# Patient Record
Sex: Female | Born: 1935 | Race: Black or African American | Hispanic: No
Health system: Southern US, Community
[De-identification: ages and names within clinical notes are randomized; demographics above are authoritative.]

---

## 2005-04-22 ENCOUNTER — Ambulatory Visit: Payer: Self-pay | Admitting: Family Medicine

## 2005-05-03 ENCOUNTER — Ambulatory Visit: Payer: Self-pay | Admitting: Family Medicine

## 2005-08-25 ENCOUNTER — Ambulatory Visit: Payer: Self-pay | Admitting: Family Medicine

## 2005-11-01 ENCOUNTER — Ambulatory Visit: Payer: Self-pay | Admitting: Family Medicine

## 2006-11-24 ENCOUNTER — Ambulatory Visit: Payer: Self-pay | Admitting: Family Medicine

## 2006-12-21 ENCOUNTER — Ambulatory Visit: Payer: Self-pay | Admitting: Gastroenterology

## 2006-12-30 ENCOUNTER — Ambulatory Visit: Payer: Self-pay | Admitting: Gastroenterology

## 2007-04-13 ENCOUNTER — Inpatient Hospital Stay: Payer: Self-pay | Admitting: Internal Medicine

## 2007-04-13 ENCOUNTER — Other Ambulatory Visit: Payer: Self-pay

## 2007-04-14 ENCOUNTER — Other Ambulatory Visit: Payer: Self-pay

## 2007-04-20 ENCOUNTER — Emergency Department: Payer: Self-pay

## 2007-04-26 ENCOUNTER — Ambulatory Visit: Payer: Self-pay | Admitting: Family Medicine

## 2007-12-22 ENCOUNTER — Ambulatory Visit: Payer: Self-pay

## 2008-04-03 ENCOUNTER — Ambulatory Visit: Payer: Self-pay

## 2008-04-26 ENCOUNTER — Ambulatory Visit: Payer: Self-pay

## 2009-06-23 ENCOUNTER — Ambulatory Visit: Payer: Self-pay | Admitting: Family Medicine

## 2009-07-13 ENCOUNTER — Emergency Department: Payer: Self-pay | Admitting: Emergency Medicine

## 2009-08-07 ENCOUNTER — Encounter: Payer: Self-pay | Admitting: Family Medicine

## 2009-09-06 ENCOUNTER — Encounter: Payer: Self-pay | Admitting: Family Medicine

## 2010-01-12 ENCOUNTER — Emergency Department: Payer: Self-pay | Admitting: Emergency Medicine

## 2010-09-24 ENCOUNTER — Ambulatory Visit: Payer: Self-pay | Admitting: Gastroenterology

## 2010-09-26 LAB — PATHOLOGY REPORT

## 2010-11-13 ENCOUNTER — Ambulatory Visit: Payer: Self-pay | Admitting: Family Medicine

## 2011-02-01 ENCOUNTER — Emergency Department: Payer: Self-pay | Admitting: Emergency Medicine

## 2011-05-01 ENCOUNTER — Emergency Department: Payer: Self-pay | Admitting: Internal Medicine

## 2011-09-17 ENCOUNTER — Emergency Department: Payer: Self-pay | Admitting: *Deleted

## 2011-09-17 LAB — URINALYSIS, COMPLETE
Bilirubin,UR: NEGATIVE
Leukocyte Esterase: NEGATIVE
Nitrite: NEGATIVE
Protein: NEGATIVE
RBC,UR: 4 /HPF (ref 0–5)
Specific Gravity: 1.014 (ref 1.003–1.030)
Squamous Epithelial: 1

## 2011-09-17 LAB — CBC
HCT: 34.5 % — ABNORMAL LOW (ref 35.0–47.0)
HGB: 11.1 g/dL — ABNORMAL LOW (ref 12.0–16.0)
MCH: 29.8 pg (ref 26.0–34.0)
MCHC: 32.1 g/dL (ref 32.0–36.0)
MCV: 93 fL (ref 80–100)
Platelet: 297 10*3/uL (ref 150–440)
RBC: 3.71 10*6/uL — ABNORMAL LOW (ref 3.80–5.20)
RDW: 14 % (ref 11.5–14.5)
WBC: 8.3 10*3/uL (ref 3.6–11.0)

## 2011-09-17 LAB — COMPREHENSIVE METABOLIC PANEL
Albumin: 3.6 g/dL (ref 3.4–5.0)
Alkaline Phosphatase: 55 U/L (ref 50–136)
Anion Gap: 8 (ref 7–16)
Calcium, Total: 9 mg/dL (ref 8.5–10.1)
Co2: 31 mmol/L (ref 21–32)
EGFR (African American): >60
EGFR (Non-African Amer.): 51 — ABNORMAL LOW
Glucose: 118 mg/dL — ABNORMAL HIGH (ref 65–99)
Potassium: 3.5 mmol/L (ref 3.5–5.1)
SGOT(AST): 19 U/L (ref 15–37)
SGPT (ALT): 16 U/L

## 2011-09-18 LAB — PROTIME-INR: INR: 2.5

## 2011-09-18 LAB — TROPONIN I: Troponin-I: <0.02 ng/mL

## 2011-09-18 LAB — LIPASE, BLOOD: Lipase: 118 U/L (ref 73–393)

## 2012-01-11 ENCOUNTER — Emergency Department: Payer: Self-pay | Admitting: Emergency Medicine

## 2012-01-11 LAB — COMPREHENSIVE METABOLIC PANEL
Albumin: 3.4 g/dL (ref 3.4–5.0)
Alkaline Phosphatase: 56 U/L (ref 50–136)
BUN: 30 mg/dL — ABNORMAL HIGH (ref 7–18)
Bilirubin,Total: 0.7 mg/dL (ref 0.2–1.0)
Calcium, Total: 9.1 mg/dL (ref 8.5–10.1)
Co2: 31 mmol/L (ref 21–32)
Creatinine: 1.3 mg/dL (ref 0.60–1.30)
EGFR (African American): 43 — ABNORMAL LOW
EGFR (Non-African Amer.): 37 — ABNORMAL LOW
Glucose: 109 mg/dL — ABNORMAL HIGH (ref 65–99)
Potassium: 3.8 mmol/L (ref 3.5–5.1)
SGOT(AST): 20 U/L (ref 15–37)
SGPT (ALT): 19 U/L
Sodium: 135 mmol/L — ABNORMAL LOW (ref 136–145)
Total Protein: 7.4 g/dL (ref 6.4–8.2)

## 2012-01-11 LAB — URINALYSIS, COMPLETE
Bilirubin,UR: NEGATIVE
Blood: NEGATIVE
Hyaline Cast: 30
Protein: NEGATIVE
RBC,UR: 1 /HPF (ref 0–5)
Specific Gravity: 1.01 (ref 1.003–1.030)

## 2012-01-11 LAB — CBC
HCT: 35.7 % (ref 35.0–47.0)
HGB: 11.5 g/dL — ABNORMAL LOW (ref 12.0–16.0)
MCH: 29.2 pg (ref 26.0–34.0)
MCHC: 32.3 g/dL (ref 32.0–36.0)
Platelet: 290 10*3/uL (ref 150–440)
RDW: 14.7 % — ABNORMAL HIGH (ref 11.5–14.5)

## 2012-01-11 LAB — LIPASE, BLOOD: Lipase: 129 U/L (ref 73–393)

## 2012-05-16 ENCOUNTER — Ambulatory Visit: Payer: Self-pay | Admitting: Specialist

## 2012-05-31 LAB — URINALYSIS, COMPLETE
Bacteria: NONE SEEN
Ph: 5 (ref 4.5–8.0)
Protein: NEGATIVE
RBC,UR: 13 /HPF (ref 0–5)
Specific Gravity: 1.013 (ref 1.003–1.030)
Squamous Epithelial: 1
WBC UR: 12 /HPF (ref 0–5)

## 2012-05-31 LAB — CBC
HCT: 36.6 % (ref 35.0–47.0)
HGB: 12.5 g/dL (ref 12.0–16.0)
MCHC: 34.1 g/dL (ref 32.0–36.0)
MCV: 89 fL (ref 80–100)
Platelet: 391 10*3/uL (ref 150–440)
RBC: 4.09 10*6/uL (ref 3.80–5.20)
RDW: 13.9 % (ref 11.5–14.5)

## 2012-05-31 LAB — COMPREHENSIVE METABOLIC PANEL
Albumin: 4 g/dL (ref 3.4–5.0)
Alkaline Phosphatase: 67 U/L (ref 50–136)
Bilirubin,Total: 0.3 mg/dL (ref 0.2–1.0)
Calcium, Total: 9.5 mg/dL (ref 8.5–10.1)
Chloride: 104 mmol/L (ref 98–107)
Co2: 29 mmol/L (ref 21–32)
EGFR (African American): >60
EGFR (Non-African Amer.): 53 — ABNORMAL LOW
Osmolality: 284 (ref 275–301)
SGOT(AST): 22 U/L (ref 15–37)
SGPT (ALT): 19 U/L (ref 12–78)

## 2012-05-31 LAB — PROTIME-INR: Prothrombin Time: 26.8 secs — ABNORMAL HIGH (ref 11.5–14.7)

## 2012-05-31 LAB — LIPASE, BLOOD: Lipase: 163 U/L (ref 73–393)

## 2012-06-01 ENCOUNTER — Inpatient Hospital Stay: Payer: Self-pay | Admitting: Student

## 2012-06-01 LAB — HEMOGLOBIN
HGB: 11.2 g/dL — ABNORMAL LOW (ref 12.0–16.0)
HGB: 10.8 g/dL — ABNORMAL LOW (ref 12.0–16.0)

## 2012-06-01 LAB — PROTIME-INR: Prothrombin Time: 24 secs — ABNORMAL HIGH (ref 11.5–14.7)

## 2012-06-02 LAB — COMPREHENSIVE METABOLIC PANEL
Albumin: 2.9 g/dL — ABNORMAL LOW (ref 3.4–5.0)
Anion Gap: 7 (ref 7–16)
Bilirubin,Total: 0.6 mg/dL (ref 0.2–1.0)
Chloride: 108 mmol/L — ABNORMAL HIGH (ref 98–107)
Co2: 28 mmol/L (ref 21–32)
Creatinine: 0.67 mg/dL (ref 0.60–1.30)
EGFR (African American): >60
Glucose: 83 mg/dL (ref 65–99)
Potassium: 3.5 mmol/L (ref 3.5–5.1)
SGOT(AST): 14 U/L — ABNORMAL LOW (ref 15–37)
SGPT (ALT): 13 U/L (ref 12–78)
Sodium: 143 mmol/L (ref 136–145)
Total Protein: 6.1 g/dL — ABNORMAL LOW (ref 6.4–8.2)

## 2012-06-02 LAB — CBC WITH DIFFERENTIAL/PLATELET
Basophil #: 0 10*3/uL (ref 0.0–0.1)
Basophil %: 1 %
Eosinophil %: 2.3 %
HCT: 30.4 % — ABNORMAL LOW (ref 35.0–47.0)
HGB: 10.1 g/dL — ABNORMAL LOW (ref 12.0–16.0)
Lymphocyte #: 1.7 10*3/uL (ref 1.0–3.6)
Lymphocyte %: 39.8 %
MCH: 29.3 pg (ref 26.0–34.0)
MCHC: 33.1 g/dL (ref 32.0–36.0)
MCV: 89 fL (ref 80–100)
Monocyte %: 12.2 %
Neutrophil #: 1.9 10*3/uL (ref 1.4–6.5)
Neutrophil %: 44.7 %
RDW: 14 % (ref 11.5–14.5)
WBC: 4.2 10*3/uL (ref 3.6–11.0)

## 2012-06-02 LAB — PROTIME-INR: Prothrombin Time: 22.1 secs — ABNORMAL HIGH (ref 11.5–14.7)

## 2012-06-03 LAB — CBC WITH DIFFERENTIAL/PLATELET
Basophil #: 0.1 10*3/uL (ref 0.0–0.1)
Eosinophil %: 1.9 %
Lymphocyte #: 1.8 10*3/uL (ref 1.0–3.6)
Lymphocyte %: 30.4 %
Monocyte %: 11.8 %
Neutrophil %: 54.9 %
Platelet: 340 10*3/uL (ref 150–440)
RDW: 14 % (ref 11.5–14.5)
WBC: 5.8 10*3/uL (ref 3.6–11.0)

## 2012-06-03 LAB — HEMOGLOBIN: HGB: 8.5 g/dL — ABNORMAL LOW (ref 12.0–16.0)

## 2012-06-03 LAB — PROTIME-INR: Prothrombin Time: 21.5 secs — ABNORMAL HIGH (ref 11.5–14.7)

## 2012-06-04 LAB — URINALYSIS, COMPLETE
Nitrite: NEGATIVE
RBC,UR: 2 /HPF (ref 0–5)
Specific Gravity: 1.008 (ref 1.003–1.030)
Squamous Epithelial: 8
WBC UR: 2 /HPF (ref 0–5)

## 2012-06-04 LAB — CBC WITH DIFFERENTIAL/PLATELET
Basophil %: 1.3 %
Eosinophil #: 0.1 10*3/uL (ref 0.0–0.7)
HGB: 8.2 g/dL — ABNORMAL LOW (ref 12.0–16.0)
Lymphocyte #: 2 10*3/uL (ref 1.0–3.6)
Lymphocyte %: 28 %
MCH: 30.5 pg (ref 26.0–34.0)
MCHC: 34.6 g/dL (ref 32.0–36.0)
Monocyte #: 0.9 x10 3/mm (ref 0.2–0.9)
Neutrophil %: 56.7 %
RBC: 2.68 10*6/uL — ABNORMAL LOW (ref 3.80–5.20)
RDW: 13.9 % (ref 11.5–14.5)

## 2012-06-04 LAB — PROTIME-INR: Prothrombin Time: 18.5 secs — ABNORMAL HIGH (ref 11.5–14.7)

## 2012-06-04 LAB — HEMOGLOBIN: HGB: 8.1 g/dL — ABNORMAL LOW (ref 12.0–16.0)

## 2012-06-05 LAB — SYNOVIAL CELL COUNT + DIFF, W/ CRYSTALS
Basophil: 0 %
Lymphocytes: 5 %
Neutrophils: 88 %
Nucleated Cell Count: 4954 /mm3
Other Mononuclear Cells: 7 %

## 2012-06-05 LAB — CBC WITH DIFFERENTIAL/PLATELET
Basophil #: 0 10*3/uL (ref 0.0–0.1)
Eosinophil #: 0.1 10*3/uL (ref 0.0–0.7)
Eosinophil %: 0.7 %
Lymphocyte %: 16.3 %
MCH: 29.9 pg (ref 26.0–34.0)
MCHC: 33.8 g/dL (ref 32.0–36.0)
Monocyte #: 1 x10 3/mm — ABNORMAL HIGH (ref 0.2–0.9)
Neutrophil %: 73.5 %
Platelet: 304 10*3/uL (ref 150–440)
RDW: 13.7 % (ref 11.5–14.5)

## 2012-06-05 LAB — URINE CULTURE

## 2012-06-06 LAB — BASIC METABOLIC PANEL
BUN: 9 mg/dL (ref 7–18)
Calcium, Total: 8.7 mg/dL (ref 8.5–10.1)
Chloride: 101 mmol/L (ref 98–107)
Co2: 28 mmol/L (ref 21–32)
Creatinine: 0.9 mg/dL (ref 0.60–1.30)
EGFR (African American): >60
EGFR (Non-African Amer.): 58 — ABNORMAL LOW
Glucose: 173 mg/dL — ABNORMAL HIGH (ref 65–99)
Potassium: 3.5 mmol/L (ref 3.5–5.1)
Sodium: 139 mmol/L (ref 136–145)

## 2012-06-06 LAB — URIC ACID: Uric Acid: 5.7 mg/dL (ref 2.6–6.0)

## 2012-06-06 LAB — CBC WITH DIFFERENTIAL/PLATELET
Basophil #: 0.1 10*3/uL (ref 0.0–0.1)
Basophil %: 0.5 %
Eosinophil %: 0 %
HCT: 24.8 % — ABNORMAL LOW (ref 35.0–47.0)
HGB: 8.4 g/dL — ABNORMAL LOW (ref 12.0–16.0)
Lymphocyte %: 6.8 %
MCHC: 33.8 g/dL (ref 32.0–36.0)
Monocyte %: 3.2 %
Neutrophil #: 12.3 10*3/uL — ABNORMAL HIGH (ref 1.4–6.5)
RBC: 2.81 10*6/uL — ABNORMAL LOW (ref 3.80–5.20)
RDW: 13.8 % (ref 11.5–14.5)
WBC: 13.7 10*3/uL — ABNORMAL HIGH (ref 3.6–11.0)

## 2012-06-07 LAB — CBC WITH DIFFERENTIAL/PLATELET
Basophil %: 0.1 %
Eosinophil #: 0 10*3/uL (ref 0.0–0.7)
Eosinophil %: 0 %
Lymphocyte #: 0.9 10*3/uL — ABNORMAL LOW (ref 1.0–3.6)
MCHC: 33.5 g/dL (ref 32.0–36.0)
MCV: 89 fL (ref 80–100)
Monocyte %: 4.8 %
Neutrophil #: 12.2 10*3/uL — ABNORMAL HIGH (ref 1.4–6.5)
Neutrophil %: 88.8 %
Platelet: 317 10*3/uL (ref 150–440)
RDW: 13.6 % (ref 11.5–14.5)
WBC: 13.7 10*3/uL — ABNORMAL HIGH (ref 3.6–11.0)

## 2012-06-08 LAB — CBC WITH DIFFERENTIAL/PLATELET
Basophil %: 0 %
Eosinophil #: 0 10*3/uL (ref 0.0–0.7)
Eosinophil %: 0 %
HCT: 24.3 % — ABNORMAL LOW (ref 35.0–47.0)
HGB: 8.4 g/dL — ABNORMAL LOW (ref 12.0–16.0)
Lymphocyte #: 1 10*3/uL (ref 1.0–3.6)
Lymphocyte %: 10.5 %
MCHC: 34.6 g/dL (ref 32.0–36.0)
MCV: 88 fL (ref 80–100)
Neutrophil #: 8 10*3/uL — ABNORMAL HIGH (ref 1.4–6.5)
Neutrophil %: 83.1 %
RBC: 2.75 10*6/uL — ABNORMAL LOW (ref 3.80–5.20)
WBC: 9.6 10*3/uL (ref 3.6–11.0)

## 2012-06-10 LAB — CULTURE, BLOOD (SINGLE)

## 2012-06-11 ENCOUNTER — Emergency Department: Payer: Self-pay | Admitting: Unknown Physician Specialty

## 2012-06-11 LAB — COMPREHENSIVE METABOLIC PANEL
Alkaline Phosphatase: 63 U/L (ref 50–136)
Anion Gap: 7 (ref 7–16)
BUN: 15 mg/dL (ref 7–18)
Bilirubin,Total: 0.3 mg/dL (ref 0.2–1.0)
Calcium, Total: 9.2 mg/dL (ref 8.5–10.1)
Chloride: 99 mmol/L (ref 98–107)
Creatinine: 0.9 mg/dL (ref 0.60–1.30)
EGFR (African American): >60
EGFR (Non-African Amer.): 58 — ABNORMAL LOW
Osmolality: 277 (ref 275–301)
Potassium: 3.6 mmol/L (ref 3.5–5.1)
SGPT (ALT): 27 U/L (ref 12–78)
Sodium: 138 mmol/L (ref 136–145)
Total Protein: 7.2 g/dL (ref 6.4–8.2)

## 2012-06-11 LAB — CBC
HCT: 30.2 % — ABNORMAL LOW (ref 35.0–47.0)
HGB: 10.2 g/dL — ABNORMAL LOW (ref 12.0–16.0)
MCH: 30.4 pg (ref 26.0–34.0)
MCHC: 33.9 g/dL (ref 32.0–36.0)
MCV: 90 fL (ref 80–100)
RDW: 14 % (ref 11.5–14.5)

## 2012-06-17 LAB — CULTURE, BLOOD (SINGLE)

## 2012-06-25 ENCOUNTER — Emergency Department: Payer: Self-pay | Admitting: Emergency Medicine

## 2012-06-25 LAB — COMPREHENSIVE METABOLIC PANEL
Anion Gap: 10 (ref 7–16)
BUN: 15 mg/dL (ref 7–18)
Bilirubin,Total: 0.7 mg/dL (ref 0.2–1.0)
Chloride: 100 mmol/L (ref 98–107)
Creatinine: 1.55 mg/dL — ABNORMAL HIGH (ref 0.60–1.30)
EGFR (African American): 35 — ABNORMAL LOW
Osmolality: 273 (ref 275–301)
Potassium: 3.9 mmol/L (ref 3.5–5.1)
Sodium: 136 mmol/L (ref 136–145)
Total Protein: 7.3 g/dL (ref 6.4–8.2)

## 2012-06-25 LAB — CBC
HGB: 10.4 g/dL — ABNORMAL LOW (ref 12.0–16.0)
MCH: 29.7 pg (ref 26.0–34.0)
MCHC: 33.4 g/dL (ref 32.0–36.0)
MCV: 89 fL (ref 80–100)
Platelet: 377 10*3/uL (ref 150–440)
RBC: 3.5 10*6/uL — ABNORMAL LOW (ref 3.80–5.20)

## 2012-06-25 LAB — PRO B NATRIURETIC PEPTIDE: B-Type Natriuretic Peptide: 599 pg/mL — ABNORMAL HIGH (ref 0–450)

## 2012-06-25 LAB — TROPONIN I: Troponin-I: <0.02 ng/mL

## 2012-07-07 LAB — URINALYSIS, COMPLETE
Blood: NEGATIVE
Glucose,UR: NEGATIVE mg/dL (ref 0–75)
Hyaline Cast: 61
Ketone: NEGATIVE
Nitrite: NEGATIVE
Ph: 5 (ref 4.5–8.0)
Protein: NEGATIVE
RBC,UR: 1 /HPF (ref 0–5)
Specific Gravity: 1.011 (ref 1.003–1.030)
WBC UR: 2 /HPF (ref 0–5)

## 2012-07-07 LAB — COMPREHENSIVE METABOLIC PANEL
Albumin: 3.3 g/dL — ABNORMAL LOW (ref 3.4–5.0)
Alkaline Phosphatase: 54 U/L (ref 50–136)
BUN: 28 mg/dL — ABNORMAL HIGH (ref 7–18)
Bilirubin,Total: 0.6 mg/dL (ref 0.2–1.0)
Calcium, Total: 8.9 mg/dL (ref 8.5–10.1)
Co2: 24 mmol/L (ref 21–32)
Creatinine: 1.93 mg/dL — ABNORMAL HIGH (ref 0.60–1.30)
EGFR (African American): 27 — ABNORMAL LOW
EGFR (Non-African Amer.): 23 — ABNORMAL LOW
Glucose: 185 mg/dL — ABNORMAL HIGH (ref 65–99)
Osmolality: 280 (ref 275–301)
Potassium: 4.8 mmol/L (ref 3.5–5.1)
SGOT(AST): 28 U/L (ref 15–37)
Sodium: 135 mmol/L — ABNORMAL LOW (ref 136–145)

## 2012-07-07 LAB — CBC WITH DIFFERENTIAL/PLATELET
Basophil #: 0.1 10*3/uL (ref 0.0–0.1)
Basophil %: 0.9 %
Eosinophil #: 0.1 10*3/uL (ref 0.0–0.7)
HCT: 31.8 % — ABNORMAL LOW (ref 40.0–52.0)
Lymphocyte #: 1.4 10*3/uL (ref 1.0–3.6)
Lymphocyte %: 13.9 %
MCHC: 32.7 g/dL (ref 32.0–36.0)
Monocyte #: 0.8 10*3/uL (ref 0.2–1.0)
Neutrophil #: 7.8 10*3/uL — ABNORMAL HIGH (ref 1.4–6.5)
RDW: 14.8 % — ABNORMAL HIGH (ref 11.5–14.5)

## 2012-07-07 LAB — PROTIME-INR
INR: 1.2
Prothrombin Time: 15.6 secs — ABNORMAL HIGH (ref 11.5–14.7)

## 2012-07-07 LAB — TROPONIN I: Troponin-I: 0.13 ng/mL — ABNORMAL HIGH

## 2012-07-08 ENCOUNTER — Inpatient Hospital Stay: Payer: Self-pay | Admitting: Internal Medicine

## 2012-07-08 LAB — BASIC METABOLIC PANEL
Anion Gap: 11 (ref 7–16)
BUN: 26 mg/dL — ABNORMAL HIGH (ref 7–18)
BUN: 25 mg/dL — ABNORMAL HIGH (ref 7–18)
Calcium, Total: 9 mg/dL (ref 8.5–10.1)
Calcium, Total: 8.5 mg/dL (ref 8.5–10.1)
Co2: 23 mmol/L (ref 21–32)
EGFR (African American): 33 — ABNORMAL LOW
EGFR (African American): 39 — ABNORMAL LOW
EGFR (Non-African Amer.): 29 — ABNORMAL LOW
EGFR (Non-African Amer.): 34 — ABNORMAL LOW
Glucose: 162 mg/dL — ABNORMAL HIGH (ref 65–99)
Glucose: 181 mg/dL — ABNORMAL HIGH (ref 65–99)
Potassium: 5.2 mmol/L — ABNORMAL HIGH (ref 3.5–5.1)
Potassium: 4.3 mmol/L (ref 3.5–5.1)
Sodium: 134 mmol/L — ABNORMAL LOW (ref 136–145)
Sodium: 138 mmol/L (ref 136–145)

## 2012-07-08 LAB — CK TOTAL AND CKMB (NOT AT ARMC)
CK, Total: 39 U/L (ref 21–232)
CK, Total: 41 U/L (ref 21–232)
CK, Total: 40 U/L (ref 21–232)
CK-MB: 1.8 ng/mL (ref 0.5–3.6)
CK-MB: 2.1 ng/mL (ref 0.5–3.6)
CK-MB: 0.8 ng/mL (ref 0.5–3.6)

## 2012-07-08 LAB — TROPONIN I
Troponin-I: 0.31 ng/mL — ABNORMAL HIGH
Troponin-I: 0.29 ng/mL — ABNORMAL HIGH

## 2012-07-09 LAB — CBC WITH DIFFERENTIAL/PLATELET
Basophil %: 0.1 %
HCT: 26.4 % — ABNORMAL LOW (ref 40.0–52.0)
HGB: 9 g/dL — ABNORMAL LOW (ref 12.0–16.0)
Lymphocyte #: 1.5 10*3/uL (ref 1.0–3.6)
Lymphocyte %: 17.6 %
MCHC: 34 g/dL (ref 32.0–36.0)
Monocyte #: 0.8 10*3/uL (ref 0.2–1.0)
Monocyte %: 9 %
Neutrophil #: 6.1 10*3/uL (ref 1.4–6.5)
Neutrophil %: 73.2 %
RBC: 2.97 10*6/uL — ABNORMAL LOW (ref 4.40–5.90)
WBC: 8.4 10*3/uL (ref 3.8–10.6)

## 2012-07-09 LAB — HEMOGLOBIN: HGB: 9.2 g/dL — ABNORMAL LOW (ref 12.0–16.0)

## 2012-07-09 LAB — BASIC METABOLIC PANEL
BUN: 23 mg/dL — ABNORMAL HIGH (ref 7–18)
Calcium, Total: 8.3 mg/dL — ABNORMAL LOW (ref 8.5–10.1)
Chloride: 110 mmol/L — ABNORMAL HIGH (ref 98–107)
Creatinine: 1.25 mg/dL (ref 0.60–1.30)
EGFR (African American): 45 — ABNORMAL LOW
EGFR (Non-African Amer.): 39 — ABNORMAL LOW
Glucose: 119 mg/dL — ABNORMAL HIGH (ref 65–99)
Potassium: 3.8 mmol/L (ref 3.5–5.1)
Sodium: 142 mmol/L (ref 136–145)

## 2012-07-09 LAB — APTT: Activated PTT: 102.5 secs — ABNORMAL HIGH (ref 23.6–35.9)

## 2012-07-10 ENCOUNTER — Ambulatory Visit: Payer: Self-pay | Admitting: Internal Medicine

## 2012-07-10 LAB — APTT: Activated PTT: 93.6 secs — ABNORMAL HIGH (ref 23.6–35.9)

## 2012-07-10 LAB — PROTIME-INR
INR: 1.3
Prothrombin Time: 16.2 secs — ABNORMAL HIGH (ref 11.5–14.7)

## 2012-07-11 LAB — APTT: Activated PTT: 73.3 secs — ABNORMAL HIGH (ref 23.6–35.9)

## 2012-07-11 LAB — BASIC METABOLIC PANEL
Anion Gap: 10 (ref 7–16)
BUN: 22 mg/dL — ABNORMAL HIGH (ref 7–18)
Calcium, Total: 8 mg/dL — ABNORMAL LOW (ref 8.5–10.1)
Chloride: 107 mmol/L (ref 98–107)
Co2: 24 mmol/L (ref 21–32)
Creatinine: 1.6 mg/dL — ABNORMAL HIGH (ref 0.60–1.30)
EGFR (Non-African Amer.): 29 — ABNORMAL LOW
Glucose: 155 mg/dL — ABNORMAL HIGH (ref 65–99)
Osmolality: 288 (ref 275–301)
Potassium: 3.8 mmol/L (ref 3.5–5.1)

## 2012-07-11 LAB — PROTIME-INR
INR: 1.9
Prothrombin Time: 22.3 secs — ABNORMAL HIGH (ref 11.5–14.7)
Prothrombin Time: 27.1 secs — ABNORMAL HIGH (ref 11.5–14.7)

## 2012-07-12 LAB — PROTIME-INR
INR: 3.1
Prothrombin Time: 31.7 secs — ABNORMAL HIGH (ref 11.5–14.7)

## 2012-07-12 LAB — CBC WITH DIFFERENTIAL/PLATELET
Bands: 2 %
Lymphocytes: 16 %
MCH: 29.9 pg (ref 26.0–34.0)
Monocytes: 5 %
NRBC/100 WBC: 5 /
Platelet: 272 10*3/uL (ref 150–440)
Segmented Neutrophils: 77 %

## 2012-07-12 LAB — BASIC METABOLIC PANEL
Anion Gap: 13 (ref 7–16)
BUN: 28 mg/dL — ABNORMAL HIGH (ref 7–18)
Chloride: 107 mmol/L (ref 98–107)
Co2: 23 mmol/L (ref 21–32)
Creatinine: 2.09 mg/dL — ABNORMAL HIGH (ref 0.60–1.30)
Glucose: 109 mg/dL — ABNORMAL HIGH (ref 65–99)
Osmolality: 291 (ref 275–301)
Potassium: 3.8 mmol/L (ref 3.5–5.1)

## 2012-07-12 LAB — APTT: Activated PTT: 38.3 secs — ABNORMAL HIGH (ref 23.6–35.9)

## 2012-07-13 LAB — CULTURE, BLOOD (SINGLE)

## 2012-08-06 ENCOUNTER — Ambulatory Visit: Payer: Self-pay | Admitting: Internal Medicine

## 2012-08-06 DEATH — deceased

## 2013-02-13 IMAGING — CT CT CHEST W/O CM
1 of 2 series · 14 of 31 positions shown, 18 images · non-contrast
Comparison: none

REASON FOR EXAM: patient tachycardic, hypotensive concern for massive PE
COMMENTS:

[Series 7: soft tissue · axial · 0.61mm/px · z∈[-300,-80]mm · 14 of 87 slices shown, 18 images]
[im 7/87  mediastinal]
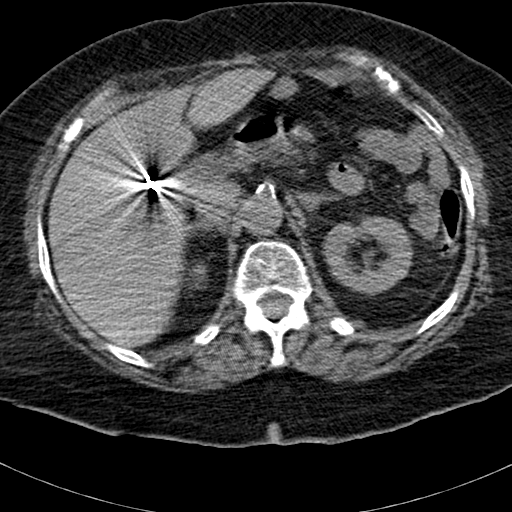
[im 7/87  lung]
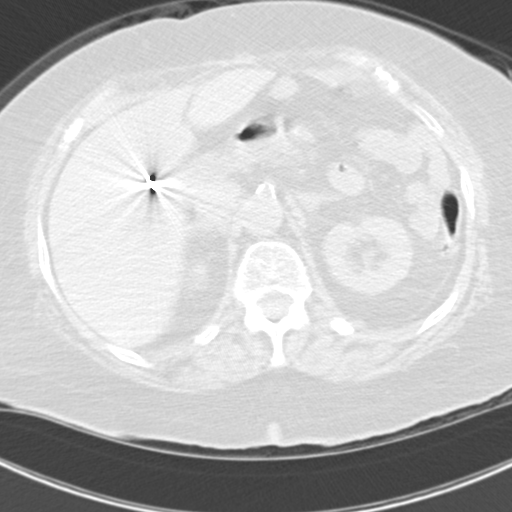
[im 14/87  lung]
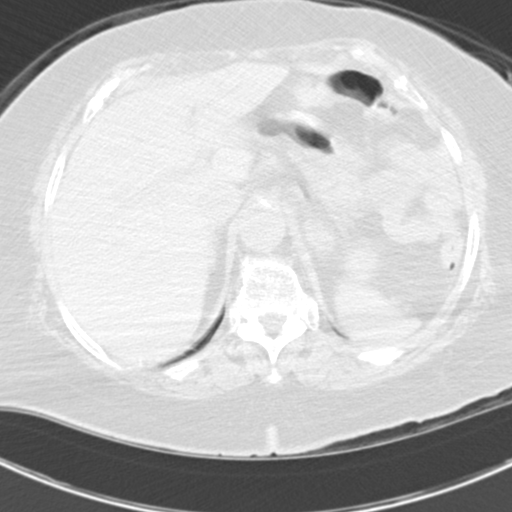
[im 20/87  lung]
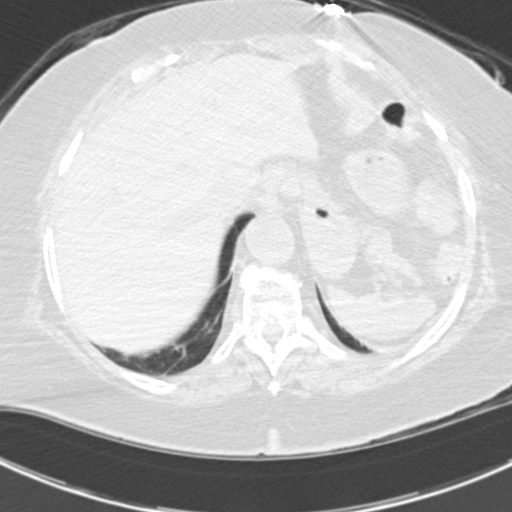
[im 27/87  lung]
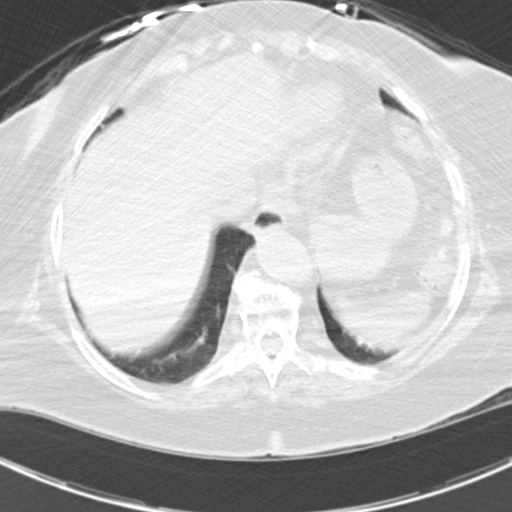
[im 34/87  mediastinal]
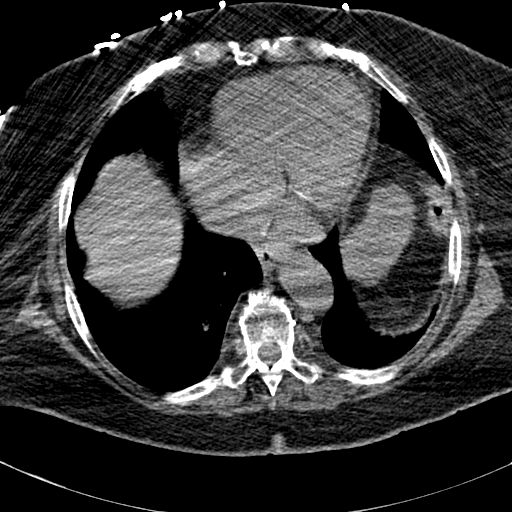
[im 34/87  lung]
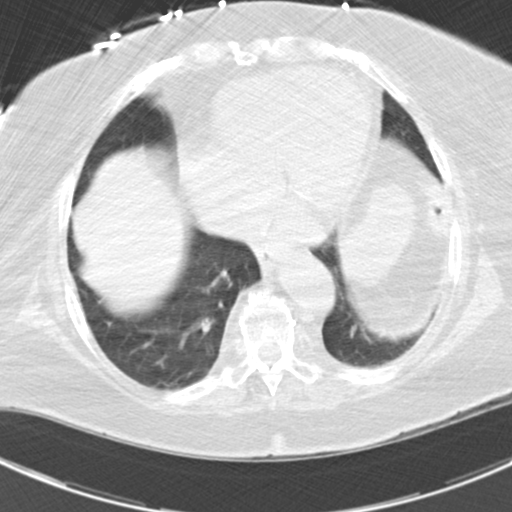
[im 40/87  lung]
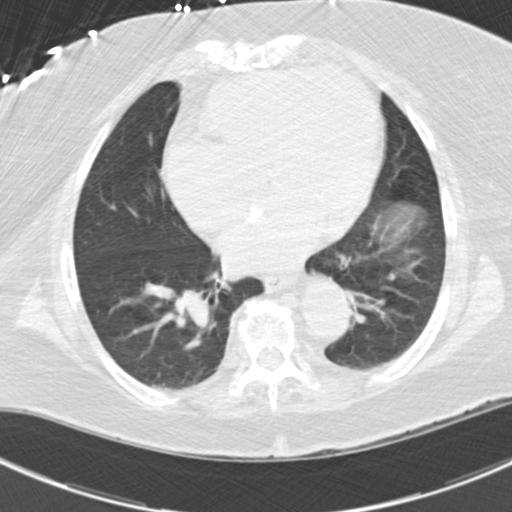
[im 41/87  lung]
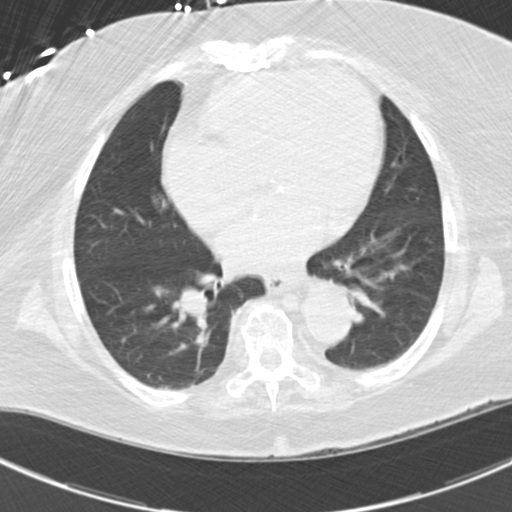
[im 44/87  lung]
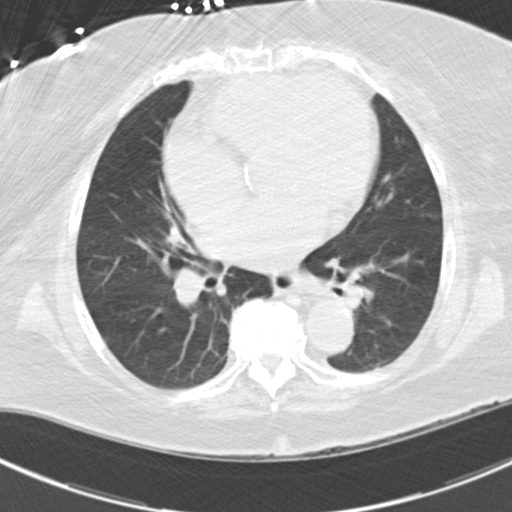
[im 47/87  mediastinal]
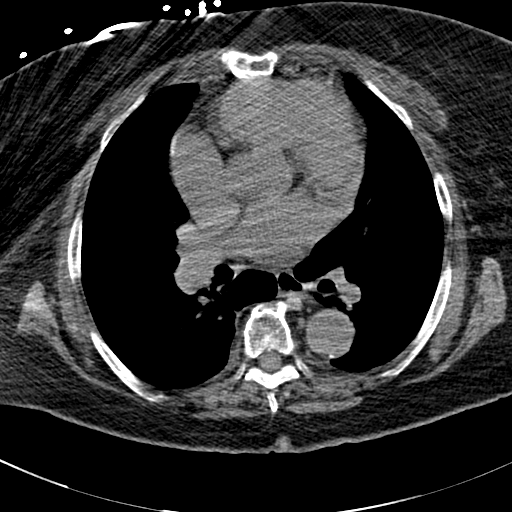
[im 47/87  lung]
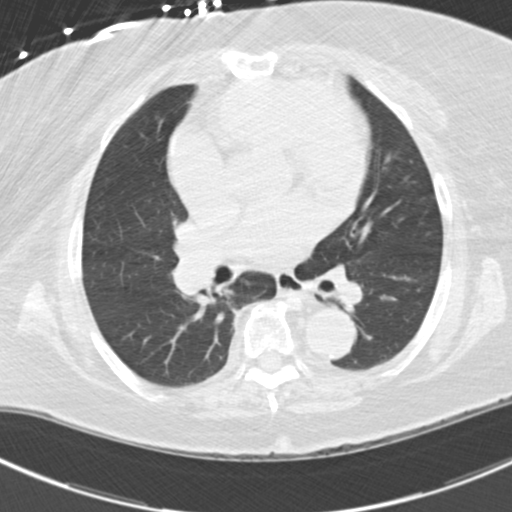
[im 53/87  lung]
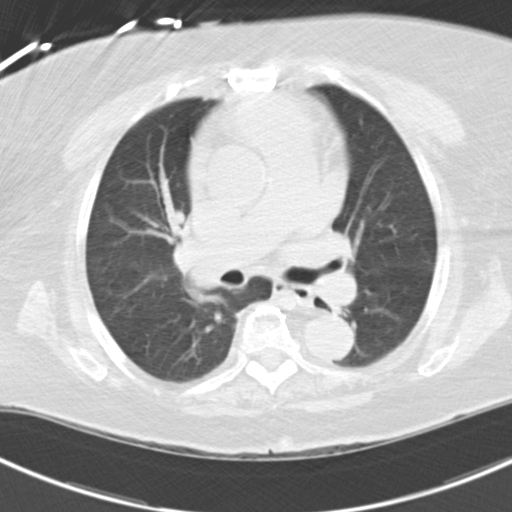
[im 60/87  lung]
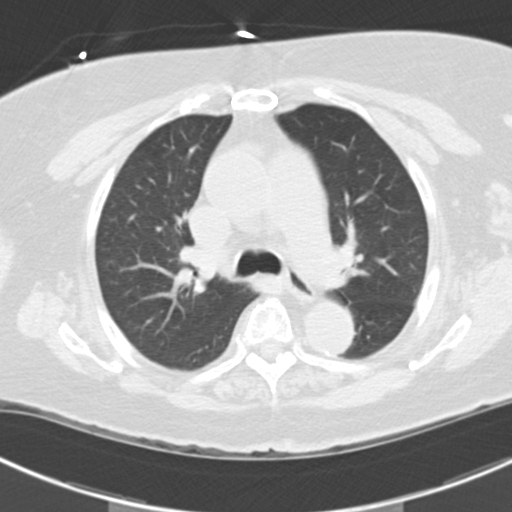
[im 67/87  lung]
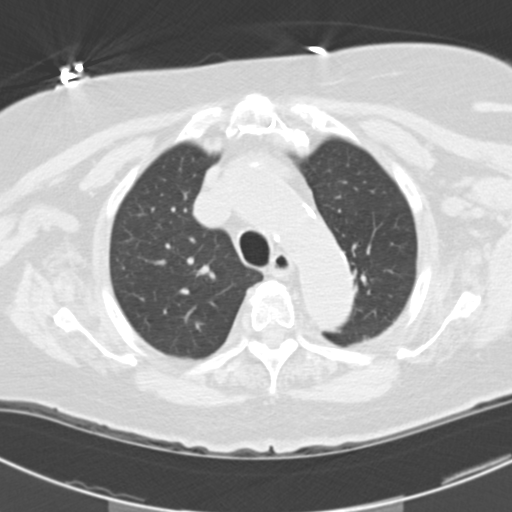
[im 73/87  mediastinal]
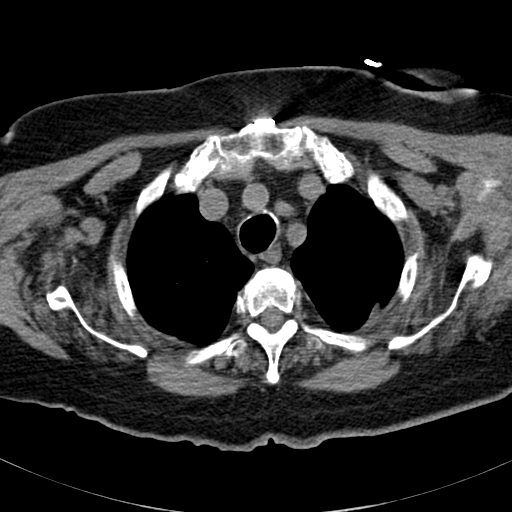
[im 73/87  lung]
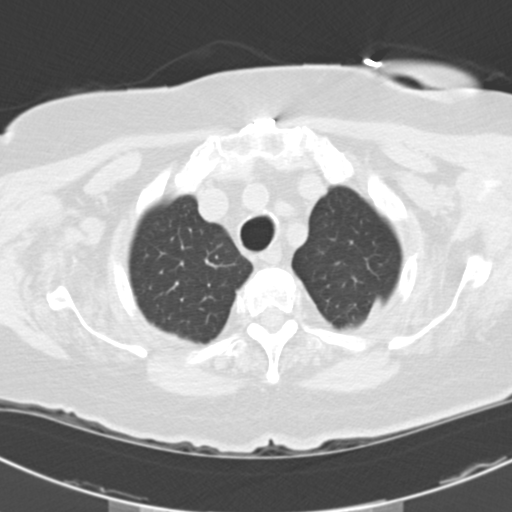
[im 80/87  lung]
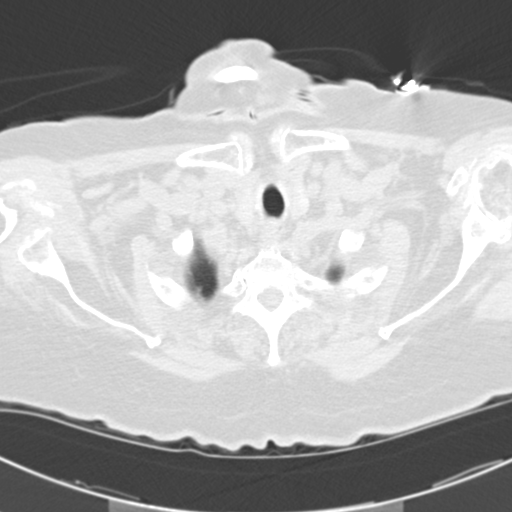

[14 of 31 positions shown; findings below may reference images not displayed]

PROCEDURE:     CT  - CT CHEST WITHOUT CONTRAST  - July 08, 2012 [DATE]

RESULT:     Attempts were made to perform CT of the chest with iodinated
intravenous contrast to evaluate the strong clinical concern for pulmonary
embolism. This was discussed at length given the patient's poor renal
function and high probability of permanent renal failure resulting from
contrast administration. This was discussed with the patient's family by the
requesting physician prior to attempting the study. Attempts were made
utilizing the existing IV. There is extravasation of contrast into the arm.
Attempts were made to place another IV which was unsuccessful. CT is
performed without contrast. There is no previous CT of the chest for
comparison.

Images are reconstructed at 3 mm slice thickness in the axial plane.
Extravasated contrast is noted at soft tissue window settings with an open
field of view in both upper extremities. Cholecystectomy clips are present.
No pleural or pericardial effusion is evident. Atherosclerotic disease is
present. The heart is borderline enlarged. There is degenerative change in
the thoracic spine. There is no underlying pulmonary mass, infiltrate or
evidence of pneumothorax.
IMPRESSION: 1. No significant pulmonary disease.
2. Extravasated contrast in both upper extremities.
3. Status post cholecystectomy.

[REDACTED]

## 2013-02-13 IMAGING — CR DG CHEST 1V PORT
1 series · 1 of 1 positions shown · non-contrast
Comparison: none

REASON FOR EXAM: sob
COMMENTS:

[portable]
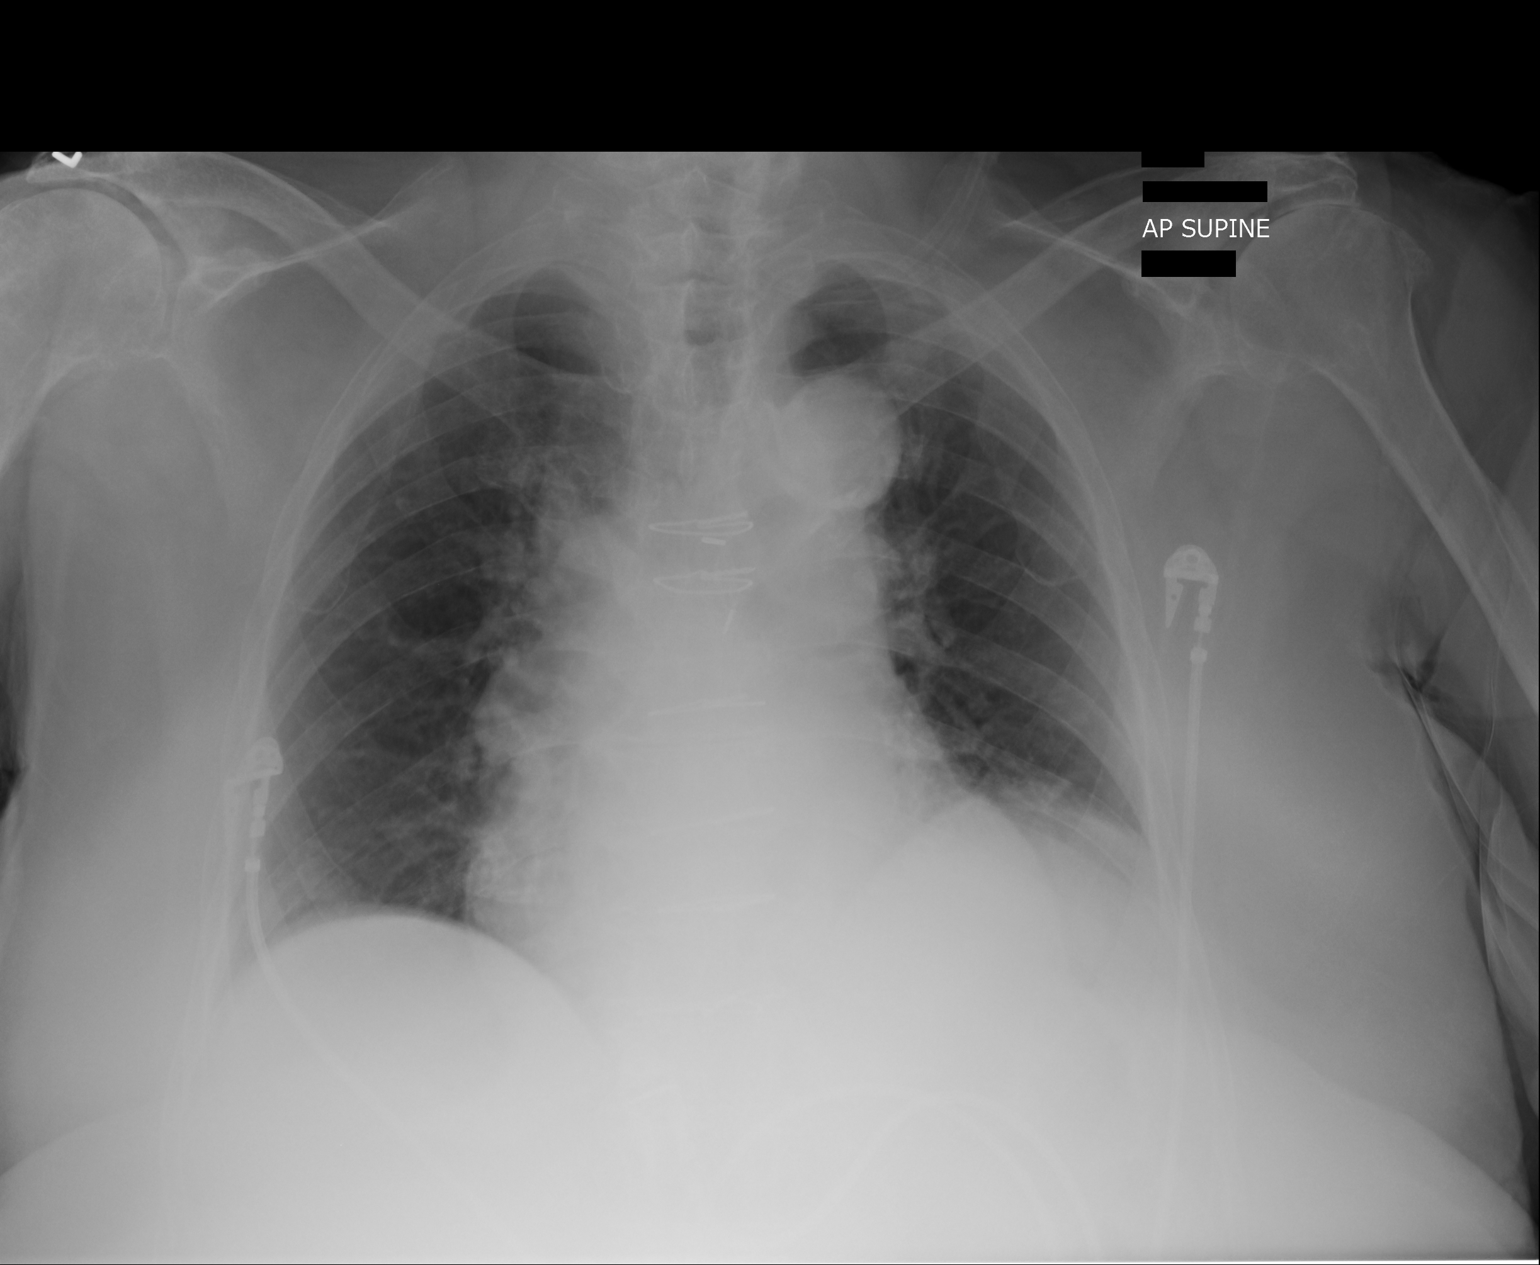

[1 of 1 positions shown; findings below may reference images not displayed]

PROCEDURE:     DXR - DXR PORTABLE CHEST SINGLE VIEW  - July 07, 2012 [DATE]

RESULT:     Comparison is made to the study 25 June, 2012.

Interstitial markings are mildly prominent without focal consolidation. The
cardiac silhouette is borderline enlarged but unchanged. There is no focal
consolidation, significant effusion or pneumothorax. Hypoinflation is
present.
IMPRESSION: Hypoinflation. Borderline cardiomegaly. Sternotomy wires
are present. No definite acute abnormality.

[REDACTED]

## 2014-12-24 NOTE — Consult Note (Signed)
Chief Complaint:   Subjective/Chief Complaint Bleeding less. Sl drop in hgb. Diet advanced.   VITAL SIGNS/ANCILLARY NOTES: **Vital Signs.:   27-Sep-13 11:38   Vital Signs Type Routine   Temperature Temperature (F) 98.2   Celsius 36.7   Pulse Pulse 92   Respirations Respirations 20   Systolic BP Systolic BP 716   Diastolic BP (mmHg) Diastolic BP (mmHg) 66   Mean BP 82   Pulse Ox % Pulse Ox % 95   Pulse Ox Activity Level  At rest   Oxygen Delivery Room Air/ 21 %   Brief Assessment:   Cardiac Regular    Respiratory clear BS    Gastrointestinal Normal   Lab Results: Hepatic:  27-Sep-13 06:27    Bilirubin, Total 0.6   Alkaline Phosphatase  46   SGPT (ALT) 13   SGOT (AST)  14   Total Protein, Serum  6.1   Albumin, Serum  2.9  Routine Chem:  27-Sep-13 06:27    Glucose, Serum 83   BUN 7   Creatinine (comp) 0.67   Sodium, Serum 143   Potassium, Serum 3.5   Chloride, Serum  108   CO2, Serum 28   Calcium (Total), Serum 8.7   Osmolality (calc) 282   eGFR (African American) >60   eGFR (Non-African American) >60 (eGFR values <32m/min/1.73 m2 may be an indication of chronic kidney disease (CKD). Calculated eGFR is useful in patients with stable renal function. The eGFR calculation will not be reliable in acutely ill patients when serum creatinine is changing rapidly. It is not useful in  patients on dialysis. The eGFR calculation may not be applicable to patients at the low and high extremes of body sizes, pregnant women, and vegetarians.)   Anion Gap 7  Routine Coag:  27-Sep-13 06:27    Prothrombin  22.1   INR 1.9 (INR reference interval applies to patients on anticoagulant therapy. A single INR therapeutic range for coumarins is not optimal for all indications; however, the suggested range for most indications is 2.0 - 3.0. Exceptions to the INR Reference Range may include: Prosthetic heart valves, acute myocardial infarction, prevention of myocardial infarction,  and combinations of aspirin and anticoagulant. The need for a higher or lower target INR must be assessed individually. Reference: The Pharmacology and Management of the Vitamin K  antagonists: the seventh ACCP Conference on Antithrombotic and Thrombolytic Therapy. CRCVEL.3810Sept:126 (3suppl): [id number] A HCT value >55% may artifactually increase the PT.  In one study,  the increase was an average of 25%. Reference:  "Effect on Routine and Special Coagulation Testing Values of Citrate Anticoagulant Adjustment in Patients with High HCT Values." American Journal of Clinical Pathology 2006;126:400-405.)  Routine Hem:  27-Sep-13 06:27    WBC (CBC) 4.2   RBC (CBC)  3.43   Hemoglobin (CBC)  10.1   Hematocrit (CBC)  30.4   Platelet Count (CBC) 320   MCV 89   MCH 29.3   MCHC 33.1   RDW 14.0   Neutrophil % 44.7   Lymphocyte % 39.8   Monocyte % 12.2   Eosinophil % 2.3   Basophil % 1.0   Neutrophil # 1.9   Lymphocyte # 1.7   Monocyte # 0.5   Eosinophil # 0.1   Basophil # 0.0 (Result(s) reported on 02 Jun 2012 at 07:14AM.)   Assessment/Plan:  Assessment/Plan:   Assessment LGI bleeding. Likely diverticular.    Plan Conservative management. However, if bleeding recurs, contact Dr. SGustavo Lah who is on call this  weekend. THanks   Electronic Signatures: Verdie Shire (MD)  (Signed 27-Sep-13 11:50)  Authored: Chief Complaint, VITAL SIGNS/ANCILLARY NOTES, Brief Assessment, Lab Results, Assessment/Plan   Last Updated: 27-Sep-13 11:50 by Verdie Shire (MD)

## 2014-12-24 NOTE — Consult Note (Signed)
Chief Complaint:   Subjective/Chief Complaint events noted.  I was called earlier this am with report of repeat rectal bleeding.  Bleeding scan negative, Slight drop of hgb.  no abdominal pain. ffp noted started by PMD to reverse PT. asa held.   VITAL SIGNS/ANCILLARY NOTES: **Vital Signs.:   28-Sep-13 15:52   Vital Signs Type Pre-Blood   Temperature Temperature (F) 98   Celsius 36.6   Temperature Source oral   Pulse Pulse 96   Pulse source if not from Vital Sign Device apical   Respirations Respirations 24   Systolic BP Systolic BP 91   Diastolic BP (mmHg) Diastolic BP (mmHg) 56   Mean BP 67   BP Source  if not from Vital Sign Device non-invasive   Pulse Ox % Pulse Ox % 94   Pulse Ox Activity Level  At rest   Oxygen Delivery Room Air/ 21 %    16:15   Vital Signs Type 15 min Post Blood Start Time   Temperature Temperature (F) 98.3   Celsius 36.8   Temperature Source oral   Pulse Pulse 93   Pulse source if not from Vital Sign Device apical   Respirations Respirations 24   Systolic BP Systolic BP 110   Diastolic BP (mmHg) Diastolic BP (mmHg) 74   Mean BP 86   BP Source  if not from Vital Sign Device non-invasive   Oxygen Delivery Room Air/ 21 %  *Intake and Output.:   28-Sep-13 08:00   Stool  bight red blood in stool    08:30   Stool  birght red blood passed    15:40   Stool  bloodyn stool dark clots with red blood surrounding and Dr Marva Panda notified   Brief Assessment:   Cardiac Irregular    Respiratory clear BS    Gastrointestinal details normal Soft  Nontender  Nondistended  No masses palpable  Bowel sounds normal    Additional Physical Exam DRE, watery/thin marroon effluent, not fresh in appearance   Lab Results: Routine Coag:  25-Sep-13 18:41    INR 2.5 (INR reference interval applies to patients on anticoagulant therapy. A single INR therapeutic range for coumarins is not optimal for all indications; however, the suggested range for most indications  is 2.0 - 3.0. Exceptions to the INR Reference Range may include: Prosthetic heart valves, acute myocardial infarction, prevention of myocardial infarction, and combinations of aspirin and anticoagulant. The need for a higher or lower target INR must be assessed individually. Reference: The Pharmacology and Management of the Vitamin K  antagonists: the seventh ACCP Conference on Antithrombotic and Thrombolytic Therapy. Chest.2004 Sept:126 (3suppl): [id number]. A HCT value >55% may artifactually increase the PT.  In one study,  the increase was an average of 25%. Reference:  "Effect on Routine and Special Coagulation Testing Values of Citrate Anticoagulant Adjustment in Patients with High HCT Values." American Journal of Clinical Pathology 2006;126:400-405.)  26-Sep-13 08:28    INR 2.1 (INR reference interval applies to patients on anticoagulant therapy. A single INR therapeutic range for coumarins is not optimal for all indications; however, the suggested range for most indications is 2.0 - 3.0. Exceptions to the INR Reference Range may include: Prosthetic heart valves, acute myocardial infarction, prevention of myocardial infarction, and combinations of aspirin and anticoagulant. The need for a higher or lower target INR must be assessed individually. Reference: The Pharmacology and Management of the Vitamin K  antagonists: the seventh ACCP Conference on Antithrombotic and Thrombolytic Therapy. Chest.2004 Sept:126 (3suppl):  [id number]. A HCT value >55% may artifactually increase the PT.  In one study,  the increase was an average of 25%. Reference:  "Effect on Routine and Special Coagulation Testing Values of Citrate Anticoagulant Adjustment in Patients with High HCT Values." American Journal of Clinical Pathology 2006;126:400-405.)  27-Sep-13 06:27    INR 1.9 (INR reference interval applies to patients on anticoagulant therapy. A single INR therapeutic range for coumarins is not  optimal for all indications; however, the suggested range for most indications is 2.0 - 3.0. Exceptions to the INR Reference Range may include: Prosthetic heart valves, acute myocardial infarction, prevention of myocardial infarction, and combinations of aspirin and anticoagulant. The need for a higher or lower target INR must be assessed individually. Reference: The Pharmacology and Management of the Vitamin K  antagonists: the seventh ACCP Conference on Antithrombotic and Thrombolytic Therapy. Chest.2004 Sept:126 (3suppl): [id number]. A HCT value >55% may artifactually increase the PT.  In one study,  the increase was an average of 25%. Reference:  "Effect on Routine and Special Coagulation Testing Values of Citrate Anticoagulant Adjustment in Patients with High HCT Values." American Journal of Clinical Pathology 2006;126:400-405.)  28-Sep-13 09:44    INR 1.8 (INR reference interval applies to patients on anticoagulant therapy. A single INR therapeutic range for coumarins is not optimal for all indications; however, the suggested range for most indications is 2.0 - 3.0. Exceptions to the INR Reference Range may include: Prosthetic heart valves, acute myocardial infarction, prevention of myocardial infarction, and combinations of aspirin and anticoagulant. The need for a higher or lower target INR must be assessed individually. Reference: The Pharmacology and Management of the Vitamin K  antagonists: the seventh ACCP Conference on Antithrombotic and Thrombolytic Therapy. Chest.2004 Sept:126 (3suppl): [id number]. A HCT value >55% may artifactually increase the PT.  In one study,  the increase was an average of 25%. Reference:  "Effect on Routine and Special Coagulation Testing Values of Citrate Anticoagulant Adjustment in Patients with High HCT Values." American Journal of Clinical Pathology 2006;126:400-405.)  Routine Hem:  25-Sep-13 18:41    Hemoglobin (CBC) 12.5  26-Sep-13 03:09     Hemoglobin (CBC)  11.2 (Result(s) reported on 01 Jun 2012 at 03:19AM.)    10:14    Hemoglobin (CBC)  10.8 (Result(s) reported on 01 Jun 2012 at 11:39AM.)  27-Sep-13 06:27    Hemoglobin (CBC)  10.1  28-Sep-13 04:56    Hemoglobin (CBC)  10.0    09:44    Hemoglobin (CBC)  9.4 (Result(s) reported on 03 Jun 2012 at 10:10AM.)   Assessment/Plan:  Assessment/Plan:   Assessment 1) hematochezia-recurrent episode-diverticular versus anal outlet.  No abdominal pain. minimal drop of hgb.  agree with ffp.  bleeding scan negative.    Plan 1) continue current, recheck hgb serially.  following.  if there is a further drop of hgb, repeat imaging on the bleeeding scan as late study.   Electronic Signatures: Barnetta ChapelSkulskie, Martin (MD)  (Signed 28-Sep-13 17:30)  Authored: Chief Complaint, VITAL SIGNS/ANCILLARY NOTES, Brief Assessment, Lab Results, Assessment/Plan   Last Updated: 28-Sep-13 17:30 by Barnetta ChapelSkulskie, Martin (MD)

## 2014-12-24 NOTE — Consult Note (Signed)
Brief Consult Note: Diagnosis: Acute lower GI bleed.  Probable diverticular in nature.   Discussed with Attending MD.   Comments: Patient's presentation discussed with Dr. Lutricia FeilPaul Oh.  Recommendation is to monitor at this time.  If bleeding continues may need to proceed with GI blood loss study.  Transfuse as necessary.  Electronic Signatures: Rodman KeyHarrison, Shakai Dolley S (NP)  (Signed 26-Sep-13 14:46)  Authored: Brief Consult Note   Last Updated: 26-Sep-13 14:46 by Rodman KeyHarrison, Guy Toney S (NP)

## 2014-12-24 NOTE — Consult Note (Signed)
Impression: 81yo BF w/ h/o afib and DM admitted with GI bleed who has fever and right hand inflammation.  She has received blood transfusion, but had elevated temp to 100.9 prior to receiving blood.  She is a poor historian due to percocet she received for her hand pain which has developed over the past few days.  Her cultures are negative to date. Her temp has only once been up to 101.5.  I agree with holding antibiotics at this time. Will ask rheumatology to see her for evalutation of possible gout in her right hand. Will await the culture results.  If she continues to spike over 101.5, will consider empiric antibiotics, but would also consider empiric therapy for gout.  Electronic Signatures: Josafat Enrico, Rosalyn GessMichael E (MD)  (Signed on 30-Sep-13 13:24)  Authored  Last Updated: 30-Sep-13 13:24 by Zidan Helget, Rosalyn GessMichael E (MD)

## 2014-12-24 NOTE — Consult Note (Signed)
PATIENT NAME:  Lori Kane, Lori Kane MR#:  [id number] DATE OF BIRTH:  01/07/1924  DATE OF CONSULTATION:  07/08/2012  REFERRING PHYSICIAN:  Enedina FinnerSona Patel, MD CONSULTING PHYSICIAN:  Yonathan Perrow D. Maleeyah Mccaughey, MD  PRIMARY CARE PHYSICIAN:  [facility]. PRIMARY CARDIOLOGIST:  Dr. Darrold JunkerParaschos.  INDICATION: Shortness of breath.   HISTORY OF PRESENT ILLNESS: The patient is an 81 year old female with multiple medical problems including atrial fibrillation and hyperlipidemia who presents with complaints of shortness of breath. The patient states that she had some intermittent shortness of breath over the preceding days. However, she went shopping and became significantly short of breath and felt bad.  She was hypotensive and had some tachycardia. There was some concern that she may have had a pulmonary embolism. She denied any significant chest pain or hemoptysis. She had  some bronchitis as an outpatient recently. She was admitted for further evaluation and management. In the Emergency Room she was found to be in atrial fibrillation with tachycardia and was placed on a heparin drip. Rate was fast and again there was some concern she may have a pulmonary embolus so she was placed on heparin.   REVIEW OF SYSTEMS: Again as above. Denied blackout spells or syncope. No nausea, vomiting, fever, chills, sweats, weight loss, weight gain, hemoptysis, or hematemesis. No bright red blood per rectum.   PAST MEDICAL HISTORY:  1. Atrial fibrillation, on Coumadin.  2. Degenerative joint disease.  3. Allergic rhinitis.  4. Hyperlipidemia.  5. Atrial myxoma.  6. Hypertension.  7. Obesity.  8. Mitral regurgitation.  9. Asthma. 10. Glaucoma.  11. Congestive heart failure.  12. Diabetes.  13. Diverticular disease.  14. Chronic renal insufficiency.   PAST SURGICAL HISTORY:  1. Left atrial myxoma removal in 1990.  2. Gallbladder surgery.   MEDICATIONS:  1. Prednisone taper.  2. Aspirin 81 mg a day.   3. Micardis 40 mg, 1/2 tablet daily.  4. Zyrtec 10 mg, 1/2 tablet at bedtime. 5. Lovastatin 10 a day.  6. Tessalon Perles. 7. Bisoprolol 5 mg a day.  8. Advair Diskus 500/50 twice a day.  9. Albuterol. 10. ProAir. 11. Lasix 40, 1/2 tablet a day.  12. Metolazone 5 mg.  13. Zithromax 250 daily.  14. Klor-Con 8 mg. 15. Flonase.  16. Osteo Bi-Flex.  17. Alphagan eyedrops. 18. Cosopt eyedrops.  19. Oxybutynin 5 mg daily.   FAMILY HISTORY: Cerebrovascular accident, prostate cancer.   SOCIAL HISTORY: She lives with granddaughter, nine children. No smoking or alcohol consumption.   PHYSICAL EXAMINATION:  VITAL SIGNS: She was hypotensive, blood pressure was 90/60, pulse 120 when I saw her in atrial fibrillation, respiratory rate 20.   HEENT: Normocephalic, atraumatic. Pupils equal, round, and reactive to light.  NECK: Supple. No jugular venous distention, bruits, or adenopathy.   LUNGS: Clear to auscultation and percussion. No significant wheeze, rhonchi, or rales.   HEART: Tachycardic, irregular. Systolic ejection murmur at the apex.  2+ edema.   NEUROLOGIC: Grossly intact.   SKIN: Normal.   LABORATORY, DIAGNOSTIC AND RADIOLOGIC DATA: White count 10, hemoglobin 10, hematocrit 32, platelet count 253. BNP 336. Troponin was elevated at 0.13. D-dimer was greater than 6.  Glucose 185, BUN 28, creatinine 1.93, sodium 135, AST, ALT negative. CT of the head: Age-related atrophy.   ASSESSMENT:  1. Dyspnea. 2. Pulmonary embolus. 3. Tachycardia.  4. Renal insufficiency.  5. Atrial fibrillation.  6. Hyperlipidemia.  7. Diet-controlled diabetes.  8. Hypertension.  9. Congestive heart failure.  10. Allergic rhinitis.  PLAN: Agree with admit. Place in the Intensive Care Unit. Continue current therapy, anticoagulation. Echocardiogram will be helpful. Consider whether this is a pulmonary embolus and does she need a [place]field filter. We will continue therapy for atrial fibrillation.   Follow up renal insufficiency. Continue diet control for diabetes, treatment for congestive heart failure. Do not recommend any invasive cardiac studies at this point. I will continue to follow the patient for now.  ____________________________ Sanyia Dini D. Juliann Pares, MD [id number] D:  07/09/2012 08:05:52 ET          T: 07/10/2012 09:08:52 ET         JOB#: [id number]  Lorean Ekstrand D Mannie Ohlin MD ELECTRONICALLY SIGNED 08/17/2012 11:22

## 2014-12-24 NOTE — Discharge Summary (Signed)
PATIENT NAME:  Lori Kane, RULE MR#:  [id number] DATE OF BIRTH:  04-04-1924  DATE OF ADMISSION:  06/01/2012 DATE OF DISCHARGE:  06/08/2012  PRIMARY CARE PHYSICIAN: Dr. Angela Adam - [facility]  CARDIOLOGIST: Marcina Millard, MD  CONSULTANTS:  1. Barnetta Chapel, MD - Gastroenterology. 2. Lutricia Feil, MD - Gastroenterology. 3. Orson Aloe, MD - Infectious Disease.  4. Saverio Danker, MD - Rheumatology.  CHIEF COMPLAINT: Lower GI bleeding.,   DISCHARGE DIAGNOSES:  1. Acute lower gastrointestinal bleed, likely from diverticular focus, also possible is arteriovenous malformation.  2. Acute pseudogout, polyarticular in nature.  3. Congestive heart failure, chronic, with ejection fraction of 40%.  4. Hemorrhagic anemia status post PRBC transfusion.  5. Coagulopathy in the setting of Coumadin. 6. Hypertension.  7. Dysphagia with EGD showing stricture status post dilation.  8. Atrial fibrillation, currently rate controlled.   DISCHARGE MEDICATIONS:  1. Metolazone 5 mg 1 tab daily 30 minutes prior to Lasix.  2. Zyrtec 10 mg 1/2 tab once a day at bedtime.  3. Oxybutynin 5 mg 1 tab daily.  4. Micardis 40 mg 1/2 tab once a day.  5. Lovastatin 10 mg at bedtime.  6. Furosemide 20 mg daily.  7. Klor-Con 8 oral tablet extended-release 1 tab twice a day. 8. Bisoprolol 5 mg daily.  9. Flonase 15 mcg/INH 2 sprays once a day.  10. Advair 500/50 mcg 1 puff twice a day. 11. Osteo Bi-Flex 2 tabs once a day.  12. Cosopt 2.23%/0.68% ophthalmic solution one drop to each eye two times a day.  13. ProAir HFA 90 mcg inhaled 2 puffs every four hours as needed for shortness of breath.  14. Albuterol nebulizer 3 mL every 4 hours as needed for shortness of breath.  15. Alphagan 0.1% ophthalmic solution one drop to each eye twice a day. 16. Prednisone 20 mg for two days, then 10 mg for two days, then stop.  17. Aspirin 81 mg daily.   NOTE: Please stop taking Coumadin and Celebrex.   DIET: Low  sodium, GI soft diet.   ACTIVITY: As tolerated.   DISCHARGE FOLLOWUP/INSTRUCTIONS: Please followup with your primary care physician within 1 to 2 weeks. Please followup with Dr. Lutricia Feil within 1 to 2 weeks. Please check a CBC within 1 to 2 weeks and if there is any further bleeding please call 911 right away.   DISPOSITION: Home with home health, PT and RN.   CODE STATUS: FULL CODE.   HISTORY OF PRESENT ILLNESS: For full details of the history and physical, please see the dictation on 06/01/2012 by Dr. Huey Bienenstock, but briefly this is a pleasant 81 year old African American female with hyperlipidemia, atrial fibrillation on Coumadin, atrial myxoma, hypertension, and history of diverticulosis per colonoscopy done in 2012 who presents with bright red blood per rectum. The patient had also an INR of 2.5, on Coumadin, and was given vitamin K and admitted to the hospitalist service for further evaluation and management. The patient did not have any chest pain or shortness of breath or lightheadedness on arrival.   SIGNIFICANT LABS AND IMAGING: Bleeding scan on 09/28 and 06/04/2012, nuclear medicine study, both no acute source of bleeding found.   X-ray, portable, one view, on 06/03/2012: No acute disease of the chest.  On 09/30/203 right two view x-rays of the hand showed findings of CPPD arthropathy and widening of the scapholunate interval as can be seen with scapholunate ligament injury.   Right knee, AP and lateral x-ray: Findings of CPPD arthropathy.  Right shoulder complete: Finding of rotator cuff arthropathy as well as degenerative changes of the acromioclavicular joint.  Initial hemoglobin 12.5, lowest hemoglobin 8.1, and last hemoglobin 8.4. Initial WBC 5.6, highest WBC 13.7, and last WBC 9.6. Last platelet 247. Initial INR 2.5, last INR 1.5 as of 06/06/2012.   Arthrocentesis of wrist and ankle showed 4954 nucleated cells, 88 neutrophils, 5 lymphocytes, and findings of calcium  pyrophosphate crystals.   Urine cultures and blood cultures from 06/04/2012 show no growth to date.   Urinalysis from 06/04/2012 shows 3+ bacteria, no nitrates, trace leukocyte esterase, and 2 WBCs.   Echocardiogram on 06/02/2012 shows regional wall motion abnormalities and ejection fraction of 40%. Atrium is mildly dilated. Mild to moderate mitral regurgitation and mild to moderate tricuspid regurgitation.   HOSPITAL COURSE:  1. Lower GI bleed. This was likely diverticular in nature and the patient was seen by Gastroenterology, Dr. Marva PandaSkulskie and Dr. Bluford Kaufmannh, throughout the hospitalization. The patient did have multiple episodes of bright red blood per rectum which did resolve early in the hospitalization but did recur. The patient underwent two nuclear medicine studies both of which were negative for acute bleed. The patient did require reversal of the coagulopathy given that she was on Coumadin. She did receive 2 units of FFP and two doses of vitamin K. At this point, she has not bled for 48 hours or so and hemoglobin is stable after 1 unit of blood transfusion. The patient will be following with Dr. Bluford Kaufmannh as an outpatient. At this point, the risk-benefit analysis is against resumption of Coumadin and per Gastroenterology we could send her out with low dose aspirin and she can follow with her cardiologist, Dr. Darrold JunkerParaschos, who can discuss with them about the resumption of Coumadin possibly.  2. Acute hemorrhagic anemia. This was likely secondary to acute lower gastrointestinal bleed, as discussed above, and the patient did have some fatigue and tachycardia and as hemoglobin was trending down did require a unit of PRBC transfusion on 06/04/2012 and hemoglobin has been stable without any further bleeding.  3. Atrial fibrillation. The patient is on a beta blocker and rate has been well controlled here. The Coumadin has been stopped and reversed as discussed above. There was some sinus rhythms also on the telemetry,  but at this point she is in atrial fibrillation/flutter. She can follow up with her outpatient cardiologist as discussed above. She will be discharged with 81 mg of aspirin for stroke risk reduction.  4. Fevers and polyarticular pain. While hospitalized she was having low-grade fevers prior to transfusion of blood products. The patient was also complaining of polyarticular, mostly right side pain. She did not have any significant erythema but did have swelling in multiple joints and rheumatology was consulted and the patient underwent arthrocentesis showing pseudogout. The patient has responded well with prednisone and Solu-Medrol IV and at this point will be going out with prednisone taper and the pain has significantly improved. The fevers have resolved and the WBC has trended down. She was seen by physical therapy and the recommendation was to be discharged home with PT and that will be arranged.   DISPOSITION: Home with home health. The patient is FULL CODE.  TOTAL TIME SPENT: 40 minutes.  ____________________________ Krystal EatonShayiq Jeriah Skufca, MD sa:slb D: 06/08/2012 13:28:26 ET T: 06/08/2012 15:22:01 ET JOB#: [id number]  cc: Krystal EatonShayiq Joette Schmoker, MD, <Dictator> [facility] Bluford Kaufmannh, MD Krystal EatonSHAYIQ Addaleigh Nicholls MD ELECTRONICALLY SIGNED 06/09/2012 15:12

## 2014-12-24 NOTE — Consult Note (Signed)
Brief Consult Note: Patient was seen by consultant.   Consult note dictated.   Orders entered.   Comments: polyarticular synovitis, suspect crystalline. rt wrist aspirated, dry.. rtknee aspirated 20 cc serosaguinous fluid,,sent fluid for microscopy. will xray rt hand, knee and shoulder. one dose of IV solumedrol tonight.  Electronic Signatures: Royann ShiversKernodle, Jr., Helen HashimotoGeorge Wallace (MD)  (Signed 30-Sep-13 18:04)  Authored: Brief Consult Note   Last Updated: 30-Sep-13 18:04 by Royann ShiversKernodle, Jr., Helen HashimotoGeorge Wallace (MD)

## 2014-12-24 NOTE — Consult Note (Signed)
Chief Complaint:   Subjective/Chief Complaint Pt sleepy from getting percocet for right arm pain. Low grade fever since yest. WBC going up. No rectal bleeding from yest. On full liquid now. Since patient has not been on solids, no complaints of dysphagia.   VITAL SIGNS/ANCILLARY NOTES: **Vital Signs.:   30-Sep-13 11:02   Vital Signs Type Routine   Temperature Temperature (F) 99.2   Celsius 37.3   Temperature Source Oral   Pulse Pulse 87   Respirations Respirations 20   Systolic BP Systolic BP 106   Diastolic BP (mmHg) Diastolic BP (mmHg) 67   Mean BP 80   Pulse Ox % Pulse Ox % 99   Pulse Ox Activity Level  At rest   Oxygen Delivery Room Air/ 21 %   Brief Assessment:   Cardiac Regular    Respiratory clear BS    Gastrointestinal Normal   Lab Results:  Routine Coag:  30-Sep-13 06:17    Prothrombin  17.4   INR 1.4 (INR reference interval applies to patients on anticoagulant therapy. A single INR therapeutic range for coumarins is not optimal for all indications; however, the suggested range for most indications is 2.0 - 3.0. Exceptions to the INR Reference Range may include: Prosthetic heart valves, acute myocardial infarction, prevention of myocardial infarction, and combinations of aspirin and anticoagulant. The need for a higher or lower target INR must be assessed individually. Reference: The Pharmacology and Management of the Vitamin K  antagonists: the seventh ACCP Conference on Antithrombotic and Thrombolytic Therapy. Chest.2004 Sept:126 (3suppl): [id number]. A HCT value >55% may artifactually increase the PT.  In one study,  the increase was an average of 25%. Reference:  "Effect on Routine and Special Coagulation Testing Values of Citrate Anticoagulant Adjustment in Patients with High HCT Values." American Journal of Clinical Pathology 2006;126:400-405.)  Routine Hem:  30-Sep-13 06:17    WBC (CBC)  11.2   RBC (CBC)  3.10   Hemoglobin (CBC)  9.3   Hematocrit  (CBC)  27.4   Platelet Count (CBC) 304   MCV 88   MCH 29.9   MCHC 33.8   RDW 13.7   Neutrophil % 73.5   Lymphocyte % 16.3   Monocyte % 9.2   Eosinophil % 0.7   Basophil % 0.3   Neutrophil #  8.3   Lymphocyte # 1.8   Monocyte #  1.0   Eosinophil # 0.1   Basophil # 0.0 (Result(s) reported on 05 Jun 2012 at [facility]06:55AM.)   Radiology Results: Nuclear Med:    29-Sep-13 00:10, GI Blood Loss Study - Nuc Med   GI Blood Loss Study - Nuc Med    REASON FOR EXAM:    bloody stools--MD has discussed with Dr Allena KatzPatel  COMMENTS:   LMP: Post-Menopausal    PROCEDURE: NM  - NM GI BLOOD LOSS STUDY  - Jun 04 2012 12:10AM     RESULT: Indication: Hematochezia    Comparison: None    Radiopharmaceutical: No additional radiopharmaceutical was injected.   Earlier same day 24.35 mCi Tc-4544m RBC was administered intravenously.    Technique: Dynamic, anterior planar images of the abdomen were obtained   over 60 minutes 11 hours following initial injection.    Findings: No abnormal focus of activity is demonstrated in the bowel to   suggest a lower GI bleed. Normal physiologic biodistribution of the   radiotracer is demonstrated throughout the abdomen.    IMPRESSION:      No scintigraphic evidence for a lower GI  bleed.    Dictation Site: 1          Verified By: Joellyn Haff, M.D., MD   Assessment/Plan:  Assessment/Plan:   Assessment LGI bleeding. Hgb stable. No bleeding since yest. INR finally below 1.5.    Plan Keep on liquid diet for now. ID to see patient. Pt already on schedule for EGD Wed. If further bleeding, then will bowel prep tomorrow and add colon for Wed. I will be at [facility] tomorrow and not available for any procedures tomorrow. Thanks.   Electronic Signatures: Lutricia Feil (MD)  (Signed 30-Sep-13 13:01)  Authored: Chief Complaint, VITAL SIGNS/ANCILLARY NOTES, Brief Assessment, Lab Results, Radiology Results, Assessment/Plan   Last Updated: 30-Sep-13 13:01 by Lutricia Feil (MD)

## 2014-12-24 NOTE — H&P (Signed)
PATIENT NAME:  Lori Kane, Lori MR#:  [id number] DATE OF BIRTH:  1924/07/26  DATE OF ADMISSION:  07/08/2012  [facility]   CARDIOLOGIST: Marcina MillardAlexander Paraschos, MD.  CHIEF COMPLAINT: Shortness of breath.   HISTORY OF PRESENT ILLNESS: 81 year old female with multiple medical problems including atrial fibrillation, hyperlipidemia, who presents with chief complaint of shortness of breath. The patient notes that she had some intermittent shortness of breath over the preceding days; however, today after going shopping she became significantly short of breath and "feeling bad". Upon evaluation in the Emergency Department, she was noted to be hypotensive and tachycardic. The concern is for pulmonary embolism. She denies any chest pain or hemoptysis. She does admit to having bronchitis in the outpatient setting recently that was treated with azithromycin. She had some associated chills and subjective fevers. She denies any hemoptysis.   Of note, she was recently hospitalized for a lower gastrointestinal bleed that was deemed to be secondary to diverticular disease versus arteriovenous malformations.   In the Emergency Department she has been started on heparin drip. VQ scan is pending. The patient does have history of atrial fibrillation for which she is on warfarin. However, her INR is subtherapeutic at 1.2. The patient denies prior history of deep venous thrombosis or pulmonary emboli.   PAST MEDICAL HISTORY:  1. Atrial fibrillation, on warfarin.  2. Degenerative joint disease.  3. Allergic rhinitis.  4. Hyperlipidemia.  5. Atrial myxoma.  6. Hypertension.  7. Obesity.  8. Mitral regurgitation.  9. Asthma.  10. Glaucoma.  11. Congestive heart failure 12. Diet-controlled diabetes mellitus.  13. Diverticular disease status post recent diverticular hemorrhage.  14. CKD stage IV.   PAST SURGICAL HISTORY:  1. Left atrial myxoma removal in 1990.  2. Gallbladder  surgery.   HOME MEDICATIONS:  1. Prednisone 10 mg 2 tablets daily for two days, then 1 tablet daily for two days.  2. Aspirin 81 mg daily.  3. Micardis 40 mg half-tablet daily.  4. Zyrtec 10 mg half-tablet at bedtime.  5. Lovastatin 10 mg 1 tablet at bedtime.  6. Tessalon Perles 100 mg 1 tablet twice daily as needed.  7. Bisoprolol 5 mg daily.  8. Advair Diskus 500/50 mcg 1 puff inhaled twice a day.  9. Albuterol 2.5/3 mL inhaled solution 3 mL inhaled every four hours as needed for shortness of breath.  10. ProAir HFA 90 micrograms 2 puffs every four hours as needed for shortness of breath.  11. Furosemide 40 mg half tablet daily.  12. Metolazone 5 mg 1 tablet once daily 30 minutes prior to Lasix.  13. Zithromax 250 mg daily.  14. Klor-Con 8 oral tablets twice a day.  15. Flonase 19 mcg daily.  16. Osteo Bi-Flex Advanced oral tablet.  17. Alphagan 0.1% ophthalmic solution one drop each eye twice a day.  18. Cosopt 2.23/0.68 ophthalmic solution one drop each eye twice a day.  19. Oxybutynin 5 mg daily.   FAMILY HISTORY: Notable for cerebrovascular accident and prostate cancer.   SOCIAL HISTORY: Lives with granddaughter. The patient has nine children. Denies alcohol, tobacco or illicit drug use.   REVIEW OF SYSTEMS: The patient denies chronic fevers, but admits to some episodes of feeling hot with some chills. EYES: No eye pain. No blurred vision. Notes that she needs new glasses. ENT: Decreased hearing right ear intermittently. No ear pain. No epistaxis. Admits to a nonproductive cough intermittent. No pleuritic chest pain. No hemoptysis. CARDIOVASCULAR: Chronic lower extremity swelling. Denies  palpitations or frank chest pain. GASTROINTESTINAL: Denies nausea, vomiting, abdominal pain, diarrhea. EXTREMITIES: Admits to generalized weakness. MUSCULOSKELETAL: Admits to right knee arthralgia and right foot pain. SKIN: No new skin rashes. HEMATOLOGY: Denies easy bruisability. PSYCH: No  depression. No anxiety. GENITOURINARY: Denies dysuria or hematuria.   PHYSICAL EXAMINATION:    VITAL SIGNS: Temperature 98, pulse 133, blood pressure 77/57, respirations 23, sating 95% on room air.   GENERAL: Elderly African American female currently in no acute respiratory distress.   HEENT: Pupils are equal, round and reactive to light. Anicteric sclerae. Normal lids, ears and nares. Posterior oropharynx is clear without erythema.   NECK: Supple. No lymphadenopathy. No thyromegaly.   CARDIAC: S1, S2, tachycardic. Pulses are equal bilaterally. There is 2+ lower extremity edema bilaterally to just above the ankles.   LUNGS: Clear to auscultation bilaterally. No wheezes, rales or rhonchi. Normal effort.   MUSCULOSKELETAL: No clubbing, no cyanosis. Normal tone. Full range of motion.   PSYCH: The patient is awake, alert, oriented x3. Judgment appears intact.   SKIN: Warm and dry. No lesions noted.   LABORATORY, RADIOLOGICAL AND DIAGNOSTIC DATA: White count 10.2, hemoglobin 10.4, hematocrit 31.8, platelets 253 with an MCV of 89. B-type natriuretic peptide 336. Troponin-I is elevated to 0.13. INR 1.2. D-dimer is greater than 6 (normal is 0 to 0.45). BMP shows glucose 185, BUN 28, creatinine 1.93, sodium 135, potassium 4.8, chloride 101, bicarbonate 24, calcium 8.9, bilirubin 0.6, alkaline phosphatase 54, ALT 16, AST 28, total protein 7.2, albumin 3.3, osmolality 280 with a GFR of 27. ABG shows pH 7.38, pCO2 37, pO2 62, FiO2 28, bicarbonate 21.9, oxygen saturation 96.4. Urinalysis shows negative proteins and nitrites. Negative leukocyte esterase. EKG shows sinus tachycardia at 133 beats per minute. CT of head: Impression: Age-related atrophy and chronic white matter ischemic changes with no evidence of an acute intracranial abnormality. Left maxillary sinus disease is noted. CT of the chest without IV contrast shows no evidence of focal or prominent consolidation, no pleural effusions, minimal  bibasilar atelectasis. There are coronary arterial calcifications. No evidence of mediastinal, axillary or hilar adenopathy. Heart is enlarged. Sternal wires consistent with previous sternotomy incision. There has been cholecystectomy.   ASSESSMENT AND PLAN: An 81 year old female presenting with acute dyspnea, found to be tachycardic, hypotensive with elevated d-dimer, concerning for pulmonary embolism.  1. Dyspnea. Concern for massive pulmonary emboli given cardiac event as well as elevated troponin. The patient has been started empirically on heparin drip. We are awaiting V/Q scan. CT of chest was without any acute infiltrative pulmonary process such as pneumonia. The patient's pretest probability by Wells criteria is 16%. We will continue empiric anticoagulation until we have further data: The risks were discussed with the patient as well as the family particularly given her recent gastrointestinal bleed. However, the patient and her family understand the risks and agree with the plan.  2. Sinus tachycardia and hypotension. No evidence of bleed. Concern for PE leading to some cardiac compromise. We will check an echocardiogram when the patient is more stable. We will trend cardiac enzymes. Given her recent bout of bronchitis and some subjective history of fever and chills, we will send blood cultures, but hold antibiotics until infectious source is identified. Pulmonary and urinary tract infection have been ruled out. The patient has a central line that was placed in the Emergency Department in case we need pressors. The patient's blood pressure is currently 94/50. 3  of diverticular bleed. Monitor CBC, patient is amenable to transfusion  4. CKD stage IV. This is stable.  5. Atrial fibrillation. We will continue the patient on heparin drip at this time. Warfarin can be resumed or changed given the subtherapeutic range on admission.  6. Hyperlipidemia, stable. Continue home medications.  7. Asthma.  Continue nebulizers.  8. Diet controlled diabetes mellitus, managed with sliding scale insulin at this time.  9. History of congestive heart failure.  10. Obesity.  11. Hypertension. Will hold off medications given the patient's current hypotensive state.  12. Congestive heart failure, stable currently.  13. CODE STATUS: FULL CODE. This was discussed extensively with the patient and family members. In the event that the patient cannot make decisions for herself, her surrogate decision makers are her children, Coolidge Breeze, Kandyce Rud or [employer]. The patient has nine children.  14. Allergic rhinitis. Continue her Cetirizine.   15. Prophylaxis. Heparin drip.   DISPOSITION: The patient is being admitted to the Intensive Care Unit for further management of her severe hypotension, tachycardia, and concern for pulmonary embolism.   CRITICAL CARE TIME SPENT: 40 minutes.   ____________________________ Aurther Loft, DO aeo:ap D: 07/08/2012 02:20:14 ET T: 07/08/2012 08:52:35 ET JOB#: [id number]  cc: Aurther Loft, DO, <Dictator> [facility] Tyshun Tuckerman E Taurus Alamo DO ELECTRONICALLY SIGNED 07/09/2012 6:51

## 2014-12-24 NOTE — Op Note (Signed)
PATIENT NAME:  Lori Kane, Lori Kane MR#:  [id number] DATE OF BIRTH:  1924/05/30  DATE OF PROCEDURE:  07/08/2012  PREOPERATIVE DIAGNOSES:  1. Deep venous thrombosis and pulmonary embolus.  2. Recent gastrointestinal bleed.  3. Right heart strain.  4. Diabetes.  5. Atrial fibrillation.  6. Tachycardia.  7. Hypertension.   POSTOPERATIVE DIAGNOSES:  1. Deep venous thrombosis and pulmonary embolus.  2. Recent gastrointestinal bleed.  3. Right heart strain.  4. Diabetes.  5. Atrial fibrillation.  6. Tachycardia.  7. Hypertension.   PROCEDURES:  1. Ultrasound guidance for vascular access, right femoral vein.  2. Catheter placement and IVC filter.  3. IVC gram.  4. Placement of a Cook Celect IVC filter.   SURGEON: Annice NeedyJason S. Kobyn Kray, M.D.   ANESTHESIA: Local with moderate conscious sedation.   ESTIMATED BLOOD LOSS: Minimal.  FLUOROSCOPY TIME: Less than one minute.   CONTRAST USED: 15 mL.   INDICATION FOR PROCEDURE: This is an 81 year old African American female admitted with severe tachycardia and right heart strain secondary to a PE. She has residual right lower extremity deep vein thrombosis. She had a recent gastrointestinal bleed. For these reasons, we are asked to place an IVC filter which is reasonable.   DESCRIPTION OF PROCEDURE: The patient was brought to the vascular interventional radiology suite, groins were shaved and prepped and a sterile surgical field was created. The right femoral vein was visualized with ultrasound and was patent at that level. It was accessed under direct ultrasound guidance without difficulty with a Seldinger needle. A J-wire was placed. After skin nick and dilatation, the delivery sheath was placed into the inferior vena cava and inferior venacavogram was then performed which showed the cava to be patent and the filter was deployed at L2 just below the renal veins. ____________________________ Annice NeedyJason S. Tunis Gentle, MD jsd:slb D: 07/08/2012 15:37:35 ET T: 07/09/2012  15:15:11 ET JOB#: [id number]  cc: Annice NeedyJason S. Tieasha Larsen, MD, <Dictator> Annice NeedyJASON S Haylie Mccutcheon MD ELECTRONICALLY SIGNED 07/10/2012 13:14

## 2014-12-24 NOTE — Consult Note (Signed)
Patient with RLE DVT, VQ suggestive of PE, and clinical concern c/w PE.  Had recent GI bleed.  Has tachycardia, trop elevated so heart strain present.  On supplemental Oxygen.  Agree with IVC filter and will place one today  Electronic Signatures: Annice Needyew, Jason S (MD)  (Signed on 02-Nov-13 16:06)  Authored  Last Updated: 02-Nov-13 16:06 by Annice Needyew, Jason S (MD)

## 2014-12-24 NOTE — H&P (Signed)
PATIENT NAME:  Lori Kane Kane, Lori Kane MR#:  [id number] DATE OF BIRTH:  03-Aug-1924  DATE OF ADMISSION:  06/01/2012  CONTINUATION   ASSESSMENT AND PLAN:  1. Lower gastrointestinal bleed/bright red blood per rectum. This is most likely related to diverticular bleed given the patient's history of extensive diverticulosis on her last colonoscopy January 2012, as well with being on warfarin with INR of 2.5. Patient's first set of hemoglobin appears to be stable. The patient was given 5 mg of vitamin K in ED.  We will monitor INR, as well we will keep checking hemoglobin and hematocrit every 8 hours, and if the patient's hemoglobin continues to drop we will obtain bleeding scan. Already the patient is typed and crossed, and we will transfuse if needed. Patient will be kept n.p.o.,  will be started on Protonix 40 mg IV b.i.d., and we will consult GI service.  2. Atrial fibrillation. Currently patient appears to be normal sinus rhythm on the EKG but on my physical exam she is irregular irregular but she is rate controlled. Secondary to her active GI bleed, we will hold her bisoprolol, and if heart rate becomes uncontrolled we will continue with p.r.n. pushes for heart rate control. We will hold her warfarin. And depends on the extent of her bleed the decision has to be made later by her cardiologist about resumption of anticoagulation.  3. Hypertension. We will hold home medication secondary to GI bleed.  4. History of congestive heart failure. The patient appears to be euvolemic at this point. We will hold her diuresis secondary to GI bleed and we will continue her on IV normal saline.    5. Hyperlipidemia. We will resume statin when she is more stable.  6. Glaucoma. We will continue home medication.  7. Deep vein thrombosis prophylaxis. We will continue with sequential compression device. We will hold anticoagulation secondary to GI bleed.   CODE STATUS: THE PATIENT IS FULL CODE.  TOTAL TIME SPENT ON ADMISSION AND  PATIENT CARE: 55 minutes.   ____________________________ Starleen Armsawood S. Lev Cervone, MD/dse:vtd D: 06/01/2012 01:35:26 ET T: 06/01/2012 08:00:12 ET  JOB#: [id number] cc: Starleen Armsawood S. [name], MD, <Dictator> [name] [name] MD ELECTRONICALLY SIGNED 06/04/2012 0:24

## 2014-12-24 NOTE — Consult Note (Signed)
PATIENT NAME:  Lori Kane, Lori Kane MR#:  [id number] DATE OF BIRTH:  11-29-1923  DATE OF CONSULTATION:  06/05/2012  REFERRING PHYSICIAN:  Dr. Jacques NavyAhmadzia    CONSULTING PHYSICIAN:  Rosalyn GessMichael E. Taevin Mcferran, MD  REASON FOR CONSULTATION: Fever.   HISTORY OF PRESENT ILLNESS: The patient is an 81 year old black female with a past history significant for atrial fibrillation and diabetes who was admitted on 09/26 with lower gastrointestinal bleeding. Her family states that for approximately 2 to 3 days she had been having bright red blood per rectum. The patient had been on Coumadin for her atrial fibrillation. She was admitted to the hospital with a hemoglobin of 12.5; however, hemoglobin dropped with hydration. She has received a blood transfusion but prior to her transfusion she had a temperature of 100.9. The transfusion was delayed and she then received the blood and then continued to have temperatures in the 100 range. Today she had a temperature elevation of 101.5. Blood and urine cultures have been obtained yesterday and are negative to date. Her urinalysis was fairly unremarkable and a chest x-ray showed no obvious infiltrates. She has been complaining of having some increased right hand pain over the last day or so and her hand has become swollen, tender, and warm. She has been given pain medications recently and is somewhat lethargic since receiving the pain medications. She was not able to provide a coherent history and the history was obtained from her family and from the chart. She has not received any antibiotics.   ALLERGIES: Codeine and flu vaccine.   PAST MEDICAL HISTORY:  1. Atrial fibrillation previously on Coumadin.  2. Osteoarthritis.  3. Hypercholesterolemia.  4. Hypertension.  5. Mitral regurgitation.  6. Asthma.  7. Diet-controlled diabetes.  8. Congestive heart failure.  9. Glaucoma.  10. Left atrial myxoma status post resection in 1990.  11. Status post cholecystectomy.   SOCIAL HISTORY:  The patient lives at home with her family. She does not smoke. She does not drink. No history of injecting drug use.   FAMILY HISTORY: Positive for stroke and prostate cancer.   REVIEW OF SYSTEMS: Unable to obtain from the patient due to her lethargy. Her family, however, denies her having any fevers, chills, or sweats at home. She had not had any urinary complaints or breathing problems other than her chronic shortness of breath related to her underlying asthma/chronic obstructive pulmonary disease. They did not report her complaining of any gastrointestinal symptoms other than the gastrointestinal bleeding. The patient was unable to provide any review of systems personally.   PHYSICAL EXAMINATION:  VITAL SIGNS: T-max 101.5, T-current 99.2, pulse 87, blood pressure 106/67, 99% on room air.   GENERAL: 81 year old black female, obese, in no acute distress, although somewhat lethargic.   HEENT: Normocephalic, atraumatic. Pupils equal and reactive to light. Extraocular motion appeared to be intact. Sclerae, conjunctivae, and lids are without evidence for emboli or petechiae. Oropharynx shows no erythema or exudate. Teeth and gums are in fair condition.   NECK: Supple. Full range of motion. Midline trachea. No lymphadenopathy. No thyromegaly.   LUNGS: Clear to auscultation bilaterally with good air movement. No focal consolidation.   HEART: Regular rate and rhythm without murmur, rub, or gallop.   ABDOMEN: Soft, nontender, and nondistended. No hepatosplenomegaly. No hernia is noted. Positive obesity.   EXTREMITIES: No evidence for tenosynovitis in the lower extremities nor in the left upper extremity. The right upper extremity, however, had warmth, swelling, and tenderness in the hand mainly over the second and  third metacarpal joints. She had pain with movement of the second through fifth fingers. She appeared to have no discomfort with wrist or elbow movement but this was somewhat difficult to  elucidate because she was somewhat lethargic from pain medicine and it was difficult to not move her hand at all. There was minimal erythema over the dorsum of the hand without significant sharp demarcation.   SKIN: There were no rashes. No stigmata of endocarditis, specifically no Janeway lesions or Osler nodes.   NEUROLOGIC: The patient was lethargic but arousable. She had difficulty providing any history. She was able to move all four extremities, but was not moving her right upper extremity due to pain.   PSYCHIATRIC: Difficult to assess due to her lethargy.   LABS/STUDIES: BUN 7, creatinine 0.67, bicarbonate 28, anion gap 7 from 09/27. LFTs were unremarkable. White count was 11.2 today with hemoglobin 9.3, platelet count of 304, ANC of 8.3. Her white count was 7.1 yesterday and 5.6 on admission. Urinalysis on admission was unremarkable except for 3+ blood. A urinalysis from 09/29 had 1+ blood, negative nitrites, trace leukocyte esterase, two red cells and two white cells per high-power field. A urine culture had mixed bacterial organisms suggestive of contamination and blood cultures are negative at 24 hours. An echocardiogram demonstrated ejection fraction of 40% with left atrium mildly dilated and right atrium mildly dilated. There was mild to moderate mitral regurgitation and mild to moderate tricuspid regurgitation noted. GI blood loss studies from 09/28 and 09/29 showed no evidence for active bleeding. A chest x-ray from 09/29 showed no infiltrates.   IMPRESSION: This is an 81 year old black female with a past history significant for atrial fibrillation and diabetes who was admitted with a GI bleed who has fever and right hand inflammation.   RECOMMENDATIONS:  1. She has received blood transfusions, but had an elevated temperature to 100.9 prior to receiving blood. She is a poor historian due to Percocet she received for her hand pain, which has developed over the past few days. Her cultures  are negative to date.  2. Her temperature has only once been up to 101.5. I agree with holding antibiotics at this time.  3. We will ask rheumatology to see her for evaluation of possible gout in her right hand.  4. We will await the culture results.  5. If she continues to spike over 101.5 would consider empiric antibiotics but would also consider empiric therapy for gout. It is possible that her GI bleeding could cause fever and some increased white count, but the bleeding appears to have stopped.      This is a moderately complex infectious disease case. Thank you very much for involving me in Mrs. Coldren's care.     ____________________________ Rosalyn Gess. Mande Auvil, MD meb:bjt D: 06/05/2012 13:33:58 ET T: 06/05/2012 14:32:13 ET JOB#: [id number]  cc: Rosalyn Gess. Longino Trefz, MD, <Dictator> Kenyatta Gloeckner E Berenise Hunton MD ELECTRONICALLY SIGNED 06/06/2012 8:15

## 2014-12-24 NOTE — Consult Note (Signed)
No further bleeding. No further drop in hgb. EGD done today due to hx of dysphagia and abnormal barium swallow. EGD showed stricture at GE junction. This was dilated with 54 Fr Maloney. Mechanical soft diet ordered. Thanks.  Electronic Signatures: Lutricia Feilh, Wilford Merryfield (MD)  (Signed on 02-Oct-13 11:59)  Authored  Last Updated: 02-Oct-13 11:59 by Lutricia Feilh, Tsion Inghram (MD)

## 2014-12-24 NOTE — Consult Note (Signed)
Chief Complaint:   Subjective/Chief Complaint several small bm overnight bloody, PT still elevated.  no nausea or abdominal pain.   VITAL SIGNS/ANCILLARY NOTES: **Vital Signs.:   29-Sep-13 10:40   Vital Signs Type Pre-Blood   Temperature Temperature (F) 100.9   Celsius 38.2   Pulse Pulse 92   Respirations Respirations 24   Systolic BP Systolic BP 124   Diastolic BP (mmHg) Diastolic BP (mmHg) 78   Mean BP 93   Pulse Ox Activity Level  At rest    10:46   Vital Signs Type Recheck   Temperature Temperature (F) 97.7   Celsius 36.5   Temperature Source oral   Respirations Respirations 24   Brief Assessment:   Cardiac Irregular    Respiratory clear BS    Gastrointestinal details normal Soft  Nontender  Nondistended  No masses palpable  Bowel sounds normal   Lab Results: Routine UA:  29-Sep-13 12:00    Color (UA) Yellow   Clarity (UA) Hazy   Glucose (UA) Negative   Bilirubin (UA) Negative   Ketones (UA) Negative   Specific Gravity (UA) 1.008   Blood (UA) 1+   pH (UA) 5.0   Protein (UA) Negative   Nitrite (UA) Negative   Leukocyte Esterase (UA) Trace (Result(s) reported on 04 Jun 2012 at 12:20PM.)   RBC (UA) 2 /HPF   WBC (UA) 2 /HPF   Bacteria (UA) 3+   Epithelial Cells (UA) 8 /HPF   Mucous (UA) PRESENT (Result(s) reported on 04 Jun 2012 at 12:20PM.)  Routine Coag:  29-Sep-13 01:52    Prothrombin  18.5   INR 1.5 (INR reference interval applies to patients on anticoagulant therapy. A single INR therapeutic range for coumarins is not optimal for all indications; however, the suggested range for most indications is 2.0 - 3.0. Exceptions to the INR Reference Range may include: Prosthetic heart valves, acute myocardial infarction, prevention of myocardial infarction, and combinations of aspirin and anticoagulant. The need for a higher or lower target INR must be assessed individually. Reference: The Pharmacology and Management of the Vitamin K  antagonists: the seventh  ACCP Conference on Antithrombotic and Thrombolytic Therapy. Chest.2004 Sept:126 (3suppl): [id number]. A HCT value >55% may artifactually increase the PT.  In one study,  the increase was an average of 25%. Reference:  "Effect on Routine and Special Coagulation Testing Values of Citrate Anticoagulant Adjustment in Patients with High HCT Values." American Journal of Clinical Pathology 2006;126:400-405.)  Routine Hem:  29-Sep-13 01:52    Hemoglobin (CBC)  8.2   WBC (CBC) 7.1   RBC (CBC)  2.68   Hematocrit (CBC)  23.6   Platelet Count (CBC) 282   MCV 88   MCH 30.5   MCHC 34.6   RDW 13.9   Neutrophil % 56.7   Lymphocyte % 28.0   Monocyte % 12.4   Eosinophil % 1.6   Basophil % 1.3   Neutrophil # 4.0   Lymphocyte # 2.0   Monocyte # 0.9   Eosinophil # 0.1   Basophil # 0.1 (Result(s) reported on 04 Jun 2012 at 02:31AM.)    09:09    Hemoglobin (CBC)  8.1 (Result(s) reported on 04 Jun 2012 at 09:36AM.)   Assessment/Plan:  Assessment/Plan:   Assessment 1) lower GI bleeeding-bleeding scan and late films are uninformative.  no abdominal pain.   Probably diverticular. discussed with Dr Jacques NavyAhmadzia.  Agree with further correction of PT and tfx.  If bleeding seems to continue, recommend slow prep tonight and tomorrow  am with colonoscopy tomorrow pm.    Plan as above.   Electronic Signatures: Barnetta Chapel (MD)  (Signed 29-Sep-13 14:29)  Authored: Chief Complaint, VITAL SIGNS/ANCILLARY NOTES, Brief Assessment, Lab Results, Assessment/Plan   Last Updated: 29-Sep-13 14:29 by Barnetta Chapel (MD)

## 2014-12-24 NOTE — H&P (Signed)
PATIENT NAME:  Lori Kane, Lori Kane MR#:  [id number] DATE OF BIRTH:  22-Dec-1923  DATE OF ADMISSION:  06/01/2012  REFERRING PHYSICIAN: Dr. Sharlyn Bologna  PRIMARY CARE PHYSICIAN:  Dr. Angela Adam at [facility]  CARDIOLOGIST: Dr. Darrold Junker.   HISTORY OF PRESENT ILLNESS: This is an 81 year old female with significant past medical history of hyperlipidemia, atrial fibrillation on warfarin, atrial myxoma, hypertension, and history of diverticulosis on colonoscopy done by Dr. Bluford Kaufmann in January 2012 who presents with complaints of bright red blood per rectum. The patient reports she had two episodes at home of bright red blood per rectum. The patient denies any previous episodes. In the ED the patient was Hemoccult positive and as well had bright blood on rectal exam by ED physician. The patient's hemoglobin was stable at 12.5. The patient is known to have history of atrial fibrillation, on warfarin for anticoagulation. With INR of 2.5, the patient was given 5 mg of vitamin K in the Emergency Department.  The patient denies any coffee-ground emesis, any lightheadedness, any dizziness, any abdominal pain, nausea, vomiting, diarrhea, or constipation. The patient reports she is ambulatory at baseline with a cane. As well she denies any chest pain or shortness of breath. The patient did not have any bowel movements during her stay in the ED. The patient's heart rate was controlled; she was not tachycardic or hypertensive in the Emergency Department.    PAST MEDICAL HISTORY:  1. Atrial fibrillation on warfarin.  2. Degenerative joint disease.  3. Allergic rhinitis.  4. Hyperlipidemia.  5. Atrial myxoma. 6. Hypertension.  7. Obesity.  8. Mitral regurgitation.  9. Asthma.  10. Glaucoma.  11. Congestive heart failure.  12. Diet-controlled diabetes.    PAST SURGICAL HISTORY:  1. Left atrial myxoma removed in 1990.  2. Gallbladder surgery.   HOME MEDICATIONS:  1. Zetia 10 mg oral daily.  2. ProAir 2 puffs  every four hours.  3. Oxybutynin 5 mg oral daily.  4. Osteo Bi-Flex 2 tablets daily.  5. Micardis 40 mg, half tablet daily.  6. Metolazone 5 mg daily.  7. Lovastatin 10 mg at bedtime.  8. Klor-Con 8 mEq 2 times a day.  9. Lasix 40 mg, half tablet daily.  10. Flonase two sprays nasal daily.  11. Warfarin 4 mg daily.  12. Cosopt eyedrops both eyes 2 times a day.  13. Celebrex 100 mg oral daily.  14. Bisoprolol 5 mg oral daily.  15. Alphagan eyedrops, one drop each eye 2 times a day.  16. Albuterol inhalation every four hours as needed.  17. Advair Diskus 500/50, 1 puff 2 times a day.   ALLERGIES: No known drug allergies.   FAMILY HISTORY: Mother died at the age of 65 of CVA. Father had prostate cancer.   SOCIAL HISTORY: No smoking. No alcohol. No drugs. Lives with family at home.   REVIEW OF SYSTEMS: CONSTITUTIONAL: The patient denies any fever, fatigue, or weakness. EYES: Denies blurry vision, double vision, or pain. RESPIRATORY: Denies cough, wheezing, hemoptysis, or dyspnea. CARDIOVASCULAR: Denies chest pain, edema, arrhythmia, palpitations, or syncope. GI: Denies nausea, vomiting, diarrhea, abdominal pain, hematemesis, coffee-ground emesis. Has bright red blood per rectum. GU: Denies dysuria, hematuria, renal colic. ENDO: Denies polyuria, polydipsia, heat or cold intolerance. HEME: Denies anemia, easy bruising, or bleeding diathesis. INTEGUMENT: Denies any acne or rash.  MUSCULOSKELETAL: Has complaints of arthritis. Denies any cramps, gout, or redness.  NEURO: Denies ataxia, dementia, headache, migraine, history of cerebrovascular accident or transient ischemic attack or seizures.  PSYCH: Denies anxiety, insomnia, schizophrenia, substance or alcohol abuse.   PHYSICAL EXAMINATION:  VITAL SIGNS: Temperature 97.7, pulse 77, respiratory rate 22, blood pressure 118/75, saturating 96% on room air.   GENERAL: Elderly, obese female looks comfortable in bed in no apparent distress.   HEENT:  Head atraumatic, normocephalic. Pupils equal, reactive to light. Pink conjunctivae. Anicteric sclerae. Moist oral mucosa.   NECK: Supple. No thyromegaly. No JVD.   CHEST: Good air entry bilaterally. No wheezing, rales, or rhonchi.   CARDIOVASCULAR: S1, S2 heard. No rubs, murmur, gallops. Irregularly irregular.   ABDOMEN: Obese, soft, nontender, nondistended. Bowel sounds present.   EXTREMITIES: No edema. No clubbing, no cyanosis.   PSYCHIATRIC: Appropriate affect. Awake, alert, oriented times three. Intact judgment and insight.   NEURO: Cranial nerves grossly intact. Motor five out of five. No focal deficits.   SKIN: Warm and dry. Normal skin turgor. No rash.   PERTINENT LABORATORY DATA: Glucose 86, BUN 16, creatinine 0.97, sodium 142, potassium 3.8, chloride 104, CO2 29, white blood cells 5.6, hemoglobin 12.5, hematocrit 36.6, platelets 391, INR 2.5. Urinalysis is showing trace leukocyte esterase and 12 white blood cells but no bacteria seen.    ASSESSMENT AND PLAN:  1. Lower gastrointestinal bleed/bright red blood per rectum. This is most likely related to diverticular bleed given the patient's history of extensive diverticulosis on her last colonoscopy January 2012, as well with being on warfarin with INR of 2.5. Patient's first set of hemoglobin appears to be stable. The patient was given 5 mg of vitamin K in ED.  We will monitor INR, as well we will keep checking hemoglobin and hematocrit every 8 hours, and if the patient's hemoglobin continues to drop we will obtain bleeding scan. Already the patient is typed and crossed, and we will transfuse if needed. Patient will be kept n.p.o.,  will be started on Protonix 40 mg IV b.i.d., and we will consult GI service.  2. Atrial fibrillation. Currently patient appears to be normal sinus rhythm on the EKG but on my physical exam she is irregular irregular but she is rate controlled. Secondary to her active GI bleed, we will hold her bisoprolol,  and if heart rate becomes uncontrolled we will continue with p.r.n. pushes for heart rate control. We will hold her warfarin. And depends on the extent of her bleed the decision has to be made later by her cardiologist about resumption of anticoagulation.  3. Hypertension. We will hold home medication secondary to GI bleed.  4. History of congestive heart failure. The patient appears to be euvolemic at this point. We will hold her diuresis secondary to GI bleed and we will continue her on IV normal saline.    5. Hyperlipidemia. We will resume statin when she is more stable.  6. Glaucoma. We will continue home medication.  7. Deep vein thrombosis prophylaxis. We will continue with sequential compression device. We will hold anticoagulation secondary to GI bleed.   CODE STATUS: THE PATIENT IS FULL CODE.  TOTAL TIME SPENT ON ADMISSION AND PATIENT CARE: 55 minutes.            ____________________________ Starleen Armsawood S. Charnel Giles, MD dse:bjt D: 06/01/2012 01:29:13 ET T: 06/01/2012 07:44:40 ET JOB#: [id number]  cc: Starleen Armsawood S. Niguel Moure, MD, <Dictator> [facility], Dr. Herminio Headsornelio Vivian Okelley S Lera Gaines MD ELECTRONICALLY SIGNED 06/04/2012 0:26

## 2014-12-24 NOTE — Consult Note (Signed)
PATIENT NAME:  Lori Kane, Lori MR#:  [id number] DATE OF BIRTH:  1924-07-28  DATE OF CONSULTATION:  07/08/2012  REFERRING PHYSICIAN:   CONSULTING PHYSICIAN:  Annice NeedyJason S. Leonce Bale, MD  REASON FOR CONSULTATION: Deep venous thrombosis and pulmonary embolus, evaluate for IVC filter.   HISTORY OF PRESENT ILLNESS: This is an 81 year old African American female who was admitted with severe tachycardia and shortness of breath. She has heart rate of well over 100. She has some elevation of her troponins with suggestion of right heart strain and her lower extremity duplex showed deep vein thrombosis present in the right lower extremity. The V/Q scan was intermediate probability but clinically was highly suspicious of pulmonary embolus. CT angiogram was not performed due to renal insufficiency. She has had a recent gastrointestinal bleed with admission to the hospital. With the recent gastrointestinal bleed, she has findings consistent with heart strain and so for these reasons with the reasonably extensive residual right lower extremity deep vein thrombosis we are asked to evaluate her for IVC filter placement.   PAST MEDICAL HISTORY:  1. Hypertension.  2. Diabetes.  3. Hyperlipidemia.  4. Diverticulosis. 5. Coronary artery disease.  6. Congestive heart failure.   PAST SURGICAL HISTORY:  1. Cholecystectomy.  2. Coronary artery bypass grafting.  3. Left atrial myxoma.   HOME MEDICATIONS: (Per the history and physical) 1. Prednisone taper.  2. Aspirin 81 mg daily.  3. Micardis 40 mg half tablet daily.  4. Zyrtec at bedtime.  5. Lovastatin 10 mg daily.  6. Tessalon Perles as needed.  7. Bisoprolol 5 mg daily. 8. Advair Diskus twice a day. 9. Albuterol inhaler as needed.  10. Pro Air inhaler as needed.  11. Lasix 40 mg half tablet daily.  12. Metolazone 5 mg daily.  13. Zithromax 250 mg daily.  14. Klor-Con 8 tablets twice a day.  15. Flonase 19 mcg daily.  16. Alphagan eyedrops. 17. Cosopt eyedrops.   18. Oxybutynin 5 mg daily.   FAMILY HISTORY: Stroke and prostate cancer.   SOCIAL HISTORY: She lives with her granddaughter. She had nine children. No alcohol or drug use.   ALLERGIES: Codeine and flu vaccine.  REVIEW OF SYSTEMS: CONSTITUTIONAL: No fevers or chills. No intentional weight loss or gain. EYES: No blurred or double vision. EARS: No tinnitus or ear pain. CARDIAC: Positive for palpitations. No chest pain or shortness of breath. RESPIRATORY: As per History of Present Illness. GI: No nausea, vomiting, or diarrhea. GU: No dysuria or hematuria. ENDOCRINE: No heat or cold intolerance. PSYCH: No anxiety or depression. NEUROLOGIC: No transient ischemic attack, stroke, or seizure. SKIN: No new rashes or ulcers.   LABS: Sodium 138, potassium 4.3, chloride 107, CO2 19, BUN 25 creatinine 1.40, and glucose 181.  BNP 8,034. Troponin 0.29. White blood cell count 10.1, hemoglobin 10.4, and platelet count 253,000.   ASSESSMENT AND PLAN: This is an 81 year old African American female with recent gastrointestinal bleed, extensive right lower extremity deep and pulmonary embolus who has     tachycardia with right heart strain and elevated troponins and that in addition to her recent gastrointestinal bleed make an IVC filter a very reasonable option. This will be placed today.   This is a level-4 consultation.  ____________________________ Annice NeedyJason S. Alcario Tinkey, MD jsd:slb D: 07/08/2012 15:19:43 ET T: 07/09/2012 14:59:50 ET JOB#: [id number]  cc: Annice NeedyJason S. Trianna Lupien, MD, <Dictator> Annice NeedyJASON S Catlin Doria MD ELECTRONICALLY SIGNED 07/10/2012 13:14

## 2014-12-24 NOTE — Consult Note (Signed)
PATIENT NAME:  Lori Kane, Lori Kane MR#:  [id number] DATE OF BIRTH:  1924-02-18  DATE OF CONSULTATION:  06/01/2012  REFERRING PHYSICIAN:  Dr. Waldron Labs  CONSULTING PHYSICIAN:  Verdie Shire, MD/Dawn Ruthe Mannan, NP  PRIMARY CARE DOCTOR: Dr. Jill Alexanders at [facility]: Isaias Cowman, MD   GASTROENTEROLOGIST: Verdie Shire, MD    REASON FOR CONSULTATION: Lower GI bleed.   HISTORY OF PRESENT ILLNESS: Ms. Fillingim is an 81 year old African American female with a significant past medical history of atrial fibrillation on warfarin, degenerative joint disease, allergic rhinitis, hyperlipidemia, atrial myxoma, hypertension, obesity, mitral regurgitation, asthma, glaucoma, congestive heart failure, and diabetes. She is under the care of Dr. Isaias Cowman and she currently takes warfarin 4 mg once a day in reference to her history of atrial fibrillation. The patient had a colonoscopy performed by Dr. Verdie Shire January 2012 which did reveal evidence of diverticulosis and one polyp being resected from the cecum. She has recently been seen in our office by myself for the concern of weight loss as well as dysphagia and she is scheduled for an upper endoscopy with dilatation next week on an outpatient basis. Her daughter notified our office yesterday for the concern of rectal bleeding which had been occurring for 24 hours prior to receiving the phone call. She had one episode of bright red blood Tuesday evening, one on Wednesday, and one episode of bleeding this morning. Initially the amount of blood was more moderate and the color of the blood was more bright red. Today it was darker colored red. The patient's hemoglobin on an outpatient basis has been stable at 12.5. Her INR was elevated at 2.5 when she arrived to the Emergency Room and she was given 5 mg of Vitamin K. She has had no associated abdominal pain. No nausea or vomiting. No hematemesis. Has experienced abdominal bloating, distention, and  excessive amount of intestinal gas as well. Shortness of breath has been a little bit worse over the past couple of days as well as loss of appetite. She has had significant weight loss over the past several weeks. Daughter does state that she has had an additional 7 pound weight loss since being seen by Dr. Saralyn Pilar on 05/29/2012. No complaints of acid reflux. No fevers.   PAST MEDICAL HISTORY:  1. Atrial fibrillation on warfarin. 2. Degenerative joint disease. 3. Allergic rhinitis. 4. Hyperlipidemia. 5. Atrial myxoma. 6. Hypertension. 7. Obesity. 8. Mitral regurgitation. 9. Asthma. 10. Glaucoma. 11. Congestive heart failure. 12. Diabetes.  PAST SURGICAL HISTORY:  1. Left atrial myxoma removal in 1990. 2. Gallbladder surgery.   HOME MEDICATIONS:  1. Zetia 10 mg a day.  2. ProAir 2 puffs every four hours. 3. Oxybutynin 5 mg daily. 4. Osteo Bi-Flex 2 tablets daily  5. Micardis 40 mg half a tablet a day.  6. Metolazone 5 mg daily.  7. Lovastatin 10 mg at bedtime.  8. Potassium 8 mEq twice a day. 9. Lasix 40 mg half a tablet a day. 10. Flonase two sprays each nostril.  11. Warfarin 4 mg a day.  12. Cosopt eyedrops both eyes two times a day. 13. Celebrex 100 mg a day.  14. Bisoprolol 5 mg daily. 15. Alphagan eyedrops one drop to each eye twice a day. 16. Albuterol inhalation every four hours as needed. 17. Advair Diskus 500/50 one puff twice daily.  18. Albuterol sulfate nebulizer solution 0.83% inhalation 2 puffs using nebulizer 4 times daily as needed for cough and wheezing.   ALLERGIES:  None.   FAMILY HISTORY: No family history of colon cancer, other GI neoplasms, or colonic polyps.   SOCIAL HISTORY: No tobacco. No alcohol use.    REVIEW OF SYSTEMS: HEENT: No fevers. No fatigue. Significant for continued weight loss. EYES: No blurred vision or double vision but does wear glasses. RESPIRATORY: Significant for coughing and wheezing. No history of asthma. CARDIOVASCULAR: No  chest pain. No orthopnea, heart palpitations, syncope. GI: See history of present illness. GU: Denies any dysuria or hematuria. ENDOCRINE: No polydipsia, polyuria, heat or cold intolerance. HEME/LYMPH: Significant for anemia on admission. No known history of chronic anemia. Significant for easy bruising and bleeding. INTEGUMENTARY: No rashes. No lesions. MUSCULOSKELETAL: Generalized arthralgia. No history of gout. NEUROLOGIC: No history of CVA, TIA, headaches. PSYCH: No depression. No anxiety.   PHYSICAL EXAMINATION:   VITAL SIGNS: Temperature 98.2, pulse 67, respirations 20, blood pressure 115/74, pulse oximetry 95%.   GENERAL: Well developed, well nourished 81 year old African American female in no acute distress. Pleasant. Numerous family members are present during interviewing process.   HEENT: Normocephalic, atraumatic. Pupils equal, reactive to light. Conjunctivae clear. Sclerae anicteric.   NECK: Supple. Trachea midline. No lymphadenopathy or thyromegaly.   PULMONARY: Symmetric rise and fall of chest. Scattered expiratory wheeze noted throughout. No use of accessory muscles.   CARDIOVASCULAR: Irregularly irregular S1, S2. No murmurs.   ABDOMEN: Soft, nondistended. Bowel sounds in four quadrants. Slightly hypoactive. No bruits. No masses.   RECTAL: No evidence of external hemorrhoids. No masses. No evidence of active bleeding. Digital exam did reveal evidence of dark blood noted on glove. No feces. No masses.   MUSCULOSKELETAL: No contractures. No clubbing.   EXTREMITIES: +1 edema, nonpitting.   NEUROLOGICAL: No gross neurological deficits.   PSYCH: Alert and oriented x4. Memory grossly intact. Appropriate affect and mood.   LABORATORY, DIAGNOSTIC, AND RADIOLOGICAL DATA: Chemistry panel on admission within normal limits except eGFR low at 53. Hepatic panel total protein elevated at 8.4, otherwise within normal limits. CBC hemoglobin 12.5 with hematocrit of 36.6 on admission. WBC  count 5.6. On serial monitoring of hemoglobin every eight hours hemoglobin dropped to 11.2 and currently 10.8. Antibody screen is negative. ABO with group plus Rh type is O+. PT is 26.8 with an INR of 2.5. Repeated today's date PT 24.0 with an INR of 2.1. Urinalysis revealed +3 blood, RBCs 13 per high-power field, WBCs 12 per high-power field.   IMPRESSION: Lower GI bleed, probable diverticular in nature. Known history of diverticulosis. The patient was on chronic anti-thrombolytic therapy thus exacerbating the risk for bleeding.   PLAN: The patient's presentation was discussed with Dr. Verdie Shire. Will continue to monitor the patient's hemodynamic status as well as hemoglobin and hematocrit noted drop. Transfuse as necessary. If bleeding does continue, may need to consider GI blood loss study. At this time though no recommendation for endoscopic evaluation.   These services provided by Payton Emerald, NP under collaborative agreement with Verdie Shire, MD.  ____________________________ Payton Emerald, NP dsh:drc D: 06/01/2012 14:45:09 ET T: 06/01/2012 16:58:04 ET JOB#: [id number]  cc: Payton Emerald, NP, <Dictator> Payton Emerald MD ELECTRONICALLY SIGNED 06/07/2012 15:14

## 2014-12-24 NOTE — Consult Note (Signed)
Pt seen and examined. See Dawn Harrison's notes. Painless rectal bleeding. Colonoscopy in 2012 showed diffuse tics and one small cecal polyp that was removed. Likely bleeding from diverticulosis. As INR comes down, expect bleeding to stop. If active bleeding again, then order bleeding scan. Pt also with dysphagia. On schedule for EGD next Wed. If bleeding stops, then discharge over the weekend and then EGD on Wed. Will follow. Thanks.  Electronic Signatures: Lutricia Feilh, Juris Gosnell (MD)  (Signed on 26-Sep-13 14:28)  Authored  Last Updated: 26-Sep-13 14:28 by Lutricia Feilh, Ramzi Brathwaite (MD)

## 2014-12-24 NOTE — Consult Note (Signed)
Comments   Josh Borders, NP, and I met with pt's daughters and grandchildren. Lengthy discussion about current medical condition. Family realizes that pt cannot return home if she does not have significant improvement in her clinical status. They also realize that at present she would not be able to participate in rehab. They would consider transfer to the [facility] if pt's condition does not improve or worsens. We will follow up tomorrow and evaluate pt. Family to discuss options that we discussed.  expressed appreciation for mtg. All questions answered.   Electronic Signatures: Jaydalee Bardwell, Izora Gala (MD)  (Signed [phone] 17:15)  Authored: Palliative Care   Last Updated: 06-Nov-13 17:15 by Annsleigh Dragoo, Izora Gala (MD)

## 2014-12-24 NOTE — Discharge Summary (Signed)
PATIENT NAME:  Lori Kane, Lori Kane MR#:  [id number] DATE OF BIRTH:  13-Aug-1924  DATE OF ADMISSION:  07/08/2012 DATE OF DISCHARGE:  07/13/2012  ADMITTING DIAGNOSIS: Suspected pulmonary embolism.   DISCHARGE DIAGNOSES:  1. Acute respiratory failure due to acute pulmonary embolism.  2. Extensive lower extremity deep vein thrombosis, status post inferior vena cava filter placement on 07/08/2012 by Dr. Wyn Quaker.  3. Hypotension due to pulmonary embolism.  4. Tachycardia due to pulmonary embolism.  5. Hypokalemia resolved on Kayexalate.  6. Elevated troponin, likely demand ischemia.  7. Generalized weakness.  8. Dysphagia. 9. Failure to thrive, adult.   DISPOSITION: Patient is being discharged to hospice home today on 07/13/2012.   DISCHARGE MEDICATIONS:  1. Roxanol 20 mg/1 mL solution 5 to 10 mg every one hour as needed.  2. Lorazepam 0.5 to 1 mg p.o. sublingually every 2 to 4 hours as needed.  3. Ranitidine 150 mg p.o. twice daily.  4. ABHR suppository. 5. Ativan 0.5/Benadryl 12.5/Haldol 0.5/Reglan 10 mg one per rectum every 4 to 6 hours as needed for nausea and vomiting.  DIET: As tolerated.   OXYGEN: 2 liters of oxygen through nasal cannula 24/7.    FOLLOW-UP APPOINTMENTS: None.   HISTORY OF PRESENT ILLNESS: Patient is an 81 year old African American female with past medical history significant for history of atrial fibrillation in the past, history of hyperlipidemia, history of degenerative joint disease, allergic rhinitis, history of recent admission into the hospital for GI bleed presented back to the hospital with complaints of sudden onset of shortness of breath and feeling bad. Please refer to Dr. Serita Sheller admission note on 07/08/2012. On arrival to the hospital patient's vitals revealed pulse 133, temperature 98, blood pressure 77/57, respiration rate 23, saturation was 95% on oxygen therapy. Patient's physical exam revealed 2+ lower extremity edema bilaterally as well as clear to  auscultation on lung exam. Otherwise patient's physical examination was remarkable for dyspnea.   LABORATORY, DIAGNOSTIC AND RADIOLOGICAL DATA: Patient's lab data on 07/07/2012 showed glucose 185, BUN and creatinine 28 and 1.93, sodium 135, otherwise BMP was unremarkable. Patient's albumin level was 3.3. Troponin was slightly elevated at 0.13 on the first set, 0.31 on the second, 0.29 on the third set, however, patient's MB fraction as well as CK totals were within normal limits. White blood cell count was normal at 10.2, hemoglobin 10.4, platelet count 253, absolute neutrophil count was slightly elevated at 7.8. Patient's d-dimer was markedly elevated at more than 6.0, pro time 12.6, INR 1.2. Blood cultures x2 taken on 07/07/2012 showed no growth. Urine cultures also showed no growth. Urinalysis was unremarkable. Patient's ABGs were done on 28% FiO2 showed pH 7.38 pCO2 37, pO2 62, saturation 96.4% on 28% FiO2 with lactic acid level of 1.6. Patient's EKG showed sinus tachy with nonspecific intraventricular conduction block, T wave abnormality, consider lateral ischemia, abnormal EKG according to EKG criteria.   HOSPITAL COURSE: Patient was admitted to the hospital. She was started on heparin already in the Emergency Room and she was loaded with Coumadin. She had echocardiogram done on 07/08/2012 echo dense lesion was noted in left atrium which was close to IVC. It was unclear what is the reason of that lesion, however, no thrombus was seen. Left ventricular systolic function was found to be normal. Ejection fraction of more than 55%. There was noted to be normal left ventricular wall thickness, septal motion abnormality was noted consistent with conduction abnormality. Left ventricular wall motion was normal. Right ventricular systolic function was found to  be normal as well, however, patient was noted to have right ventricular systolic pressures elevated at 50 to 60 mmHg and mild to moderate tricuspid  regurgitation was also noted consistent with pulmonary embolism. Patient had chest x-ray done on 07/07/2012 because of shortness of breath. Chest hypoinflation as well as borderline cardiomegaly was noted but otherwise no definite abnormalities. Because of high suspicion of pulmonary embolism  patient underwent nuclear medical study lung V/Q on 07/08/2012 which was abnormal ventilation perfusion lung scan, overall probability was considered to be low to intermediate. The fact that there was greater activity in right upper lobe and right lower lung base suggesting nonocclusive thrombi. Correlation with lower extremity Doppler's was recommended. Patient underwent ultrasound of her lower extremities bilateral on 07/08/2012 and extensive thrombus occlusive in mid superficial femoral vein through the popliteal vein as well as nonocclusive in proximal superficial femoral vein to mid superficial femoral vein on the right was noted. No left lower extremity thrombus was noted. Patient was consulted by Dr. Wyn Quakerew who inserted IVC filter on 07/08/2012, however, even after this and heparin as well as Coumadin loading as well as supportive therapy with medications patient's condition deteriorated and continue to worsen. Patient remained hypotensive as well as tachycardic and patient's family made decision to transfer patient to hospice home after making decision not to resuscitate her. She is being transferred to hospice home for palliative care only on 07/13/2012 in relatively unstable hemodynamic condition with above-mentioned medications and follow up. Her vital signs on the day of discharge: Temperature 97.3, pulse ranging from 100s to 110s but as low as 40s, going down as low as 40s. Patient's respiration rate 20, blood pressure ranging from 80s to 90s systolic and 50s to 60s diastolic. Oxygen saturation was 97% to 98% on 3 liters of oxygen through nasal cannula.   TIME SPENT: 40 minutes.    ____________________________ Katharina Caperima Dearion Huot, MD rv:cms D: 07/13/2012 16:38:24 ET T: 2011-10-06 12:29:40 ET JOB#: [id number]  cc: Katharina Caperima Alany Borman, MD, <Dictator> [facility] Makara Lanzo MD ELECTRONICALLY SIGNED 07/21/2012 13:03

## 2014-12-24 NOTE — Consult Note (Signed)
PATIENT NAME:  Lori Kane, Lori Kane MR#:  [id number] DATE OF BIRTH:  1924/04/13  DATE OF CONSULTATION:  06/05/2012  REFERRING PHYSICIAN:  Dr. Leavy CellaBlocker  CONSULTING PHYSICIAN:  Kandyce RudGeorge W. Kernodle Jr., MD  REASON FOR CONSULTATION: Right hand swollen, rule out gout.   HISTORY OF PRESENT ILLNESS: 81 year old white female who has history of cardiomyopathy, atrial fibrillation. She has been on Coumadin. She was admitted with GI bleed. Says she has chronically taken Celebrex for joint pain, particularly knee and shoulder. She never had podagra or gout of which he is aware. With her GI bleed she came off of Celebrex, started having some pain in the right shoulder, felt like she had a hard time raising it. Since then she has had pain and swelling in the right wrist, doesn't really want to move it, right third MCP, some pain in the ankle and the knee on the right side. The left hand has not bothered her. She has had a low grade fever. She has pending endoscopy and colonoscopy upcoming. Is obviously not on any nonsteroidals. White count recently was 11,000, hemoglobin down to 9.3 after blood transfusion. She has normal creatinine. Echocardiogram shows mitral regurgitation, ejection fraction of 40.   PAST MEDICAL HISTORY:  1. Congestive heart failure. 2. Atrial fibrillation. 3. Diabetes. 4. Hypertension. 5. Status post left atrial myxoma excision. 6. Sleep apnea. 7. Morbid obesity.   SOCIAL HISTORY: No cigarettes or alcohol.   FAMILY HISTORY: No family history of connective tissue disease.   REVIEW OF SYSTEMS: As above. No particular headaches, rash. No nodules. No kidney stones.   PHYSICAL EXAMINATION:  GENERAL: Pleasant female, hurts when moving her right arm. No rash.   HEENT: Sclerae clear.   MUSCULOSKELETAL: Good range of motion of neck. Mild painful range of motion right shoulder. No definite shoulder effusion. Left shoulder moves well. There is synovitis of the right wrist with pain in flexion and  extension. There is synovitis right second MCP with erythema and tenderness to touch. Decreased grip in the right. Left hand moves well. No inflammatory synovitis. Right knee effusion. She has pain with motion of the right ankle. MTP is nontender. Left ankle moves well.   PROCEDURE #1: Right wrist prepped in sterile manner. Aspirate dry.   PROCEDURE #2: Right knee prepped in a sterile manner. Aspirated 20 mL serosanguineous fluid, sent for microscopy.   IMPRESSION: Suspect polyarticular synovitis, suspect crystalline though she has not had history of gout or pseudogout in the past.  PLAN: She is in a good deal of pain tonight. Will give her one dose of Solu-Medrol 40 mg IV. X-ray right shoulder, right knee, right hand use. Serum uric acid. Fluid was sent for microscopy.   ____________________________ Kandyce RudGeorge W. Kernodle Jr., MD gwk:cms D: 06/05/2012 18:09:06 ET T: 06/06/2012 09:31:28 ET JOB#: [id number]  cc: Kandyce RudGeorge W. Kernodle Jr., MD, <Dictator> Rosalyn GessMichael E. Blocker, MD [facility] GEORGE W KERNODLE J MD ELECTRONICALLY SIGNED 06/06/2012 11:03
